# Patient Record
Sex: Female | Born: 1975 | Race: Black or African American | Hispanic: No | Smoking: Current some day smoker
Health system: Southern US, Community
[De-identification: ages and names within clinical notes are randomized; demographics above are authoritative.]

## PROBLEM LIST (undated history)

## (undated) DIAGNOSIS — L409 Psoriasis, unspecified: Secondary | ICD-10-CM

## (undated) DIAGNOSIS — D649 Anemia, unspecified: Secondary | ICD-10-CM

## (undated) DIAGNOSIS — Z5189 Encounter for other specified aftercare: Secondary | ICD-10-CM

## (undated) DIAGNOSIS — M1990 Unspecified osteoarthritis, unspecified site: Secondary | ICD-10-CM

## (undated) DIAGNOSIS — K611 Rectal abscess: Secondary | ICD-10-CM

## (undated) DIAGNOSIS — F32A Depression, unspecified: Secondary | ICD-10-CM

## (undated) HISTORY — PX: TUBAL LIGATION: SHX77

## (undated) HISTORY — DX: Psoriasis, unspecified: L40.9

## (undated) HISTORY — DX: Encounter for other specified aftercare: Z51.89

## (undated) HISTORY — DX: Depression, unspecified: F32.A

## (undated) HISTORY — PX: COLON SURGERY: SHX602

## (undated) HISTORY — DX: Anemia, unspecified: D64.9

## (undated) HISTORY — DX: Unspecified osteoarthritis, unspecified site: M19.90

## (undated) HISTORY — PX: COLOSTOMY REVERSAL: SHX5782

## (undated) HISTORY — PX: COLOSTOMY: SHX63

## (undated) HISTORY — PX: RECTAL SURGERY: SHX760

---

## 2012-10-22 LAB — HM HIV SCREENING LAB: HM HIV Screening: NEGATIVE

## 2012-12-12 LAB — HM HEPATITIS C SCREENING LAB: HM Hepatitis Screen: NEGATIVE

## 2021-06-06 LAB — COLOGUARD: Cologuard: POSITIVE — AB

## 2021-06-09 LAB — COLOGUARD: COLOGUARD: POSITIVE — AB

## 2021-11-15 LAB — HM COLONOSCOPY

## 2022-02-21 ENCOUNTER — Other Ambulatory Visit: Payer: Self-pay

## 2022-02-21 ENCOUNTER — Ambulatory Visit (INDEPENDENT_AMBULATORY_CARE_PROVIDER_SITE_OTHER): Payer: Self-pay

## 2022-02-21 ENCOUNTER — Encounter (HOSPITAL_COMMUNITY): Payer: Self-pay | Admitting: Emergency Medicine

## 2022-02-21 ENCOUNTER — Ambulatory Visit (HOSPITAL_COMMUNITY)
Admission: EM | Admit: 2022-02-21 | Discharge: 2022-02-21 | Disposition: A | Discharge status: Home / Self Care | Payer: Self-pay | Attending: Family Medicine | Admitting: Family Medicine

## 2022-02-21 DIAGNOSIS — R079 Chest pain, unspecified: Secondary | ICD-10-CM

## 2022-02-21 DIAGNOSIS — M549 Dorsalgia, unspecified: Secondary | ICD-10-CM | POA: Insufficient documentation

## 2022-02-21 DIAGNOSIS — R1010 Upper abdominal pain, unspecified: Secondary | ICD-10-CM | POA: Insufficient documentation

## 2022-02-21 HISTORY — DX: Rectal abscess: K61.1

## 2022-02-21 LAB — POCT URINALYSIS DIPSTICK, ED / UC
Bilirubin Urine: NEGATIVE
Glucose, UA: NEGATIVE mg/dL
Hgb urine dipstick: NEGATIVE
Leukocytes,Ua: NEGATIVE
Nitrite: NEGATIVE
Protein, ur: NEGATIVE mg/dL
Specific Gravity, Urine: 1.02 (ref 1.005–1.030)
Urobilinogen, UA: 0.2 mg/dL (ref 0.0–1.0)
pH: 6.5 (ref 5.0–8.0)

## 2022-02-21 LAB — CBC WITH DIFFERENTIAL/PLATELET
Abs Immature Granulocytes: 0.02 10*3/uL (ref 0.00–0.07)
Basophils Absolute: 0.1 10*3/uL (ref 0.0–0.1)
Basophils Relative: 1 %
Eosinophils Absolute: 0.1 10*3/uL (ref 0.0–0.5)
Eosinophils Relative: 1 %
HCT: 37.2 % (ref 36.0–46.0)
Hemoglobin: 12.1 g/dL (ref 12.0–15.0)
Immature Granulocytes: 0 %
Lymphocytes Relative: 40 %
Lymphs Abs: 4.3 10*3/uL — ABNORMAL HIGH (ref 0.7–4.0)
MCH: 30.6 pg (ref 26.0–34.0)
MCHC: 32.5 g/dL (ref 30.0–36.0)
MCV: 94.2 fL (ref 80.0–100.0)
Monocytes Absolute: 0.8 10*3/uL (ref 0.1–1.0)
Monocytes Relative: 7 %
Neutro Abs: 5.4 10*3/uL (ref 1.7–7.7)
Neutrophils Relative %: 51 %
Platelets: 333 10*3/uL (ref 150–400)
RBC: 3.95 MIL/uL (ref 3.87–5.11)
RDW: 14.6 % (ref 11.5–15.5)
WBC: 10.7 10*3/uL — ABNORMAL HIGH (ref 4.0–10.5)
nRBC: 0 % (ref 0.0–0.2)

## 2022-02-21 LAB — COMPREHENSIVE METABOLIC PANEL
ALT: 15 U/L (ref 0–44)
AST: 19 U/L (ref 15–41)
Albumin: 4 g/dL (ref 3.5–5.0)
Alkaline Phosphatase: 46 U/L (ref 38–126)
Anion gap: 10 (ref 5–15)
BUN: 6 mg/dL (ref 6–20)
CO2: 24 mmol/L (ref 22–32)
Calcium: 9.1 mg/dL (ref 8.9–10.3)
Chloride: 104 mmol/L (ref 98–111)
Creatinine, Ser: 0.71 mg/dL (ref 0.44–1.00)
GFR, Estimated: >60 mL/min (ref >60)
Glucose, Bld: 90 mg/dL (ref 70–99)
Potassium: 4.5 mmol/L (ref 3.5–5.1)
Sodium: 138 mmol/L (ref 135–145)
Total Bilirubin: 0.4 mg/dL (ref 0.3–1.2)
Total Protein: 7.1 g/dL (ref 6.5–8.1)

## 2022-02-21 LAB — LIPASE, BLOOD: Lipase: 28 U/L (ref 11–51)

## 2022-02-21 LAB — POCT RAPID STREP A, ED / UC: Streptococcus, Group A Screen (Direct): NEGATIVE

## 2022-02-21 MED ORDER — DICLOFENAC SODIUM 75 MG PO TBEC: 75.0000 mg | DELAYED_RELEASE_TABLET | Freq: Two times a day (BID) | ORAL | 0 refills | Status: DC

## 2022-02-21 NOTE — ED Triage Notes (Addendum)
Back pain started one week ago.  Lower back pain radiating around to front of torso, under ribs.  Burning with urination approx one month ago, but none now.    Bowel movements are normally irregular.  Reports of cyst on rectum requiring surgery and patient had a colostomy bag.  This has since been reversed

## 2022-02-21 NOTE — Discharge Instructions (Signed)
You have had labs (blood work) went today. We will call you with any significant abnormalities or if there is need to begin or change treatment or pursue further follow up.  You may also review your test results online through [facility]. If you do not have a MyChart account, instructions to sign up should be on your discharge paperwork.  You have been seen today for abdominal pain. Your evaluation was not suggestive of any emergent condition requiring medical intervention at this time. However, some abdominal problems make take more time to appear. Therefore, it is very important for you to pay attention to any new symptoms or worsening of your current condition.  Please return here or to the Emergency Department immediately should you begin to feel worse in any way or have any of the following symptoms: increasing or different abdominal pain, persistent vomiting, inability to drink fluids, fevers, or shaking chills.

## 2022-02-22 NOTE — ED Provider Notes (Signed)
[facility]   [id number] 02/21/22 Arrival Time: 1550  ASSESSMENT & PLAN:  1. Mid back pain   2. Upper abdominal pain    Unclear etiology of her symptoms. Lab work pending. Benign abdomen. Question adhesion pain from distant abdominal surgeries. Able to ambulate here and hemodynamically stable.  I have personally viewed the imaging studies ordered this visit. Chest xray without acute changes.  Begin: Meds ordered this encounter  Medications   diclofenac (VOLTAREN) 75 MG EC tablet    Sig: Take 1 tablet (75 mg total) by mouth 2 (two) times daily.    Dispense:  14 tablet    Refill:  0   Encourage ROM/movement as tolerated.  Recommend:  Follow-up Information     [facility].   Specialty: Emergency Medicine Why: If symptoms worsen in any way. Contact information: [address] [id number] [place] [phone]                Reviewed expectations re: course of current medical issues. Questions answered. Outlined signs and symptoms indicating need for more acute intervention. Patient verbalized understanding. After Visit Summary given.   SUBJECTIVE: History from: patient.  Olivia Hernandez is a 46 y.o. female who presents with complaint of vague intermittent bilateral lower back discomfort that radiates into abdomen. Onset gradual. First noted  approx one weeks ago . Injury/trama: no. History of back problems requiring medical care: occasional. Pain described as aching when present. Aggravating factors: have not been identified. Alleviating factors: have not been identified. Progressive LE weakness or saddle anesthesia: none. Extremity sensation changes or weakness: none. Ambulatory without difficulty. Normal bowel/bladder habits: yes; without urinary retention. Normal PO intake without n/v. No associated abdominal pain/n/v. History of distant abdominal surgery for colostomy and  reversal.  Reports no chronic steroid use, fevers, IV drug use, or recent back surgeries or procedures.  OBJECTIVE:  Vitals:   02/21/22 1654  BP: 126/82  Pulse: 76  Resp: 18  Temp: 98.3 F (36.8 C)  TempSrc: Oral  SpO2: 99%    General appearance: alert; no distress HEENT: ; AT Neck: supple with FROM; without midline tenderness CV: regular Lungs: unlabored respirations; speaks full sentences without difficulty Abdomen: soft, non-tender; non-distended Back: mild  and poorly localized tenderness to palpation over lumbar paraspinal musculature ; FROM at waist; bruising: none; without midline tenderness Extremities: without edema; symmetrical without gross deformities; normal ROM of bilateral LE Skin: warm and dry Neurologic: normal gait; normal sensation and strength of bilateral LE Psychological: alert and cooperative; normal mood and affect  Labs:  Labs Reviewed  CBC WITH DIFFERENTIAL/PLATELET   POCT URINALYSIS DIPSTICK, ED / UC - Abnormal; Notable for the following components:   Ketones, ur TRACE (*)    All other components within normal limits  COMPREHENSIVE METABOLIC PANEL  LIPASE, BLOOD  POC URINE PREG, ED  POCT RAPID STREP A, ED / UC    Imaging: DG Chest 2 View  Result Date: 02/21/2022 CLINICAL DATA:  Chest pain EXAM: CHEST - 2 VIEW COMPARISON:  None Available. FINDINGS: Cardiac size is within normal limits. There are no signs of pulmonary edema or focal pulmonary consolidation. There is no pleural effusion or pneumothorax. Pectus excavatum deformity is noted. IMPRESSION: No active cardiopulmonary disease. Electronically Signed   By: Elmer Picker M.D.   On: 02/21/2022 18:27    No Known Allergies  Past Medical History:  Diagnosis Date   Rectal abscess    Social History  Socioeconomic History   Marital status: Single    Spouse name: Not on file   Number of children: Not on file   Years of education: Not on file   Highest education level: Not on  file  Occupational History   Not on file  Tobacco Use   Smoking status: Every Day    Types: Cigarettes   Smokeless tobacco: Never  Vaping Use   Vaping Use: Never used  Substance and Sexual Activity   Alcohol use: Yes   Drug use: Never   Sexual activity: Not on file  Other Topics Concern   Not on file  Social History Narrative   Not on file   Social Determinants of Health   Financial Resource Strain: Not on file  Food Insecurity: Not on file  Transportation Needs: Not on file  Physical Activity: Not on file  Stress: Not on file  Social Connections: Not on file  Intimate Partner Violence: Not on file   History reviewed. No pertinent family history. Past Surgical History:  Procedure Laterality Date   COLOSTOMY     COLOSTOMY REVERSAL     RECTAL SURGERY        Vanessa Kick, MD 02/22/22 [phone]

## 2023-02-18 ENCOUNTER — Ambulatory Visit (INDEPENDENT_AMBULATORY_CARE_PROVIDER_SITE_OTHER): Payer: Commercial Managed Care - PPO | Admitting: Nurse Practitioner

## 2023-02-18 ENCOUNTER — Encounter: Payer: Self-pay | Admitting: Nurse Practitioner

## 2023-02-18 VITALS — BP 112/82 | HR 80 | Temp 97.3°F | Ht 69.0 in | Wt 151.8 lb

## 2023-02-18 DIAGNOSIS — L304 Erythema intertrigo: Secondary | ICD-10-CM

## 2023-02-18 DIAGNOSIS — M0609 Rheumatoid arthritis without rheumatoid factor, multiple sites: Secondary | ICD-10-CM | POA: Diagnosis not present

## 2023-02-18 DIAGNOSIS — H60501 Unspecified acute noninfective otitis externa, right ear: Secondary | ICD-10-CM

## 2023-02-18 DIAGNOSIS — K5909 Other constipation: Secondary | ICD-10-CM

## 2023-02-18 DIAGNOSIS — Z1231 Encounter for screening mammogram for malignant neoplasm of breast: Secondary | ICD-10-CM

## 2023-02-18 MED ORDER — OFLOXACIN 0.3 % OT SOLN: 5.0000 [drp] | Freq: Every day | OTIC | 0 refills | Status: DC

## 2023-02-18 MED ORDER — NYSTATIN 100000 UNIT/GM EX POWD: 1.0000 | Freq: Three times a day (TID) | CUTANEOUS | 0 refills | Status: DC

## 2023-02-18 NOTE — Patient Instructions (Addendum)
It was great to see you!  We have you on the schedule for the mobile mammogram at our office on 03/18/23 at [facility] the ear drops, 5 drops in your right ear once a day for 7 days.   Let's follow-up in 4 weeks, sooner if you have concerns.  If a referral was placed today, you will be contacted for an appointment. Please note that routine referrals can sometimes take up to 3-4 weeks to process. Please call our office if you haven't heard anything after this time frame.  Take care,  Rodman Pickle, NP

## 2023-02-18 NOTE — Progress Notes (Unsigned)
New Patient Visit  BP 112/82 (BP Location: Left Arm)   Pulse 80   Temp (!) 97.3 F (36.3 C)   Ht 5\' 9"  (1.753 m)   Wt 151 lb 12.8 oz (68.9 kg)   LMP 12/28/2022 (Exact Date)   SpO2 98%   BMI 22.42 kg/m    Subjective:    Patient ID: Olivia Hernandez, female    DOB: 06/14/1976, 47 y.o.   MRN: [id number]  CC: Chief Complaint  Patient presents with   Establish Care    NP. Est. Care, concerns with right ear pain and fluid in it, medication management    HPI: Olivia Hernandez is a 47 y.o. female presents for new patient visit to establish care.  Introduced to Publishing rights manager role and practice setting.  All questions answered.  Discussed provider/patient relationship and expectations.  She has a history of chronic constipation. She has been treating this by eating dragon fruit regularly, drinking water, and taking miralax as needed. She denies abdominal pain, nausea, and vomiting.    She has a history of rheumatoid artritis. She was taking humira in the past. She states that a few weeks ago she was having pain in her left wrist, but that has improved. She states that the most of the pain is in her wrists and knees, although it will sometimes happen in her ankles.   She also notes that for the last few days she has been having itching and drainage from her right ear. She states that she has a history of having fluid in her ears. She denies recent swimming. She denies pain, fevers, nasal congestion, and sore throat. She has tried to clean her ear with a q-tip.   Depression and Anxiety Screen done:     02/18/2023    3:08 PM  Depression screen PHQ 2/9  Decreased Interest 1  Down, Depressed, Hopeless 0  PHQ - 2 Score 1  Altered sleeping 3  Tired, decreased energy 1  Change in appetite 1  Feeling bad or failure about yourself  0  Trouble concentrating 0  Moving slowly or fidgety/restless 0  Suicidal thoughts 0  PHQ-9 Score 6  Difficult doing work/chores Not difficult at all       02/18/2023    3:09 PM  GAD 7 : Generalized Anxiety Score  Nervous, Anxious, on Edge 1  Control/stop worrying 1  Worry too much - different things 1  Trouble relaxing 1  Restless 1  Easily annoyed or irritable 0  Afraid - awful might happen 0  Total GAD 7 Score 5  Anxiety Difficulty Not difficult at all    Past Medical History:  Diagnosis Date   Anemia    Arthritis    Blood transfusion without reported diagnosis    Depression    Rectal abscess     Past Surgical History:  Procedure Laterality Date   CESAREAN SECTION  05/01/2004   COLON SURGERY  07/06/2014   Had a colostomy surgery   COLOSTOMY     COLOSTOMY REVERSAL     RECTAL SURGERY     TUBAL LIGATION  05/01/2004    Family History  Problem Relation Age of Onset   Alcohol abuse Mother    COPD Mother    Diabetes Mother    Drug abuse Mother    Heart disease Mother    Hypertension Mother    Stroke Mother    Varicose Veins Mother    Cancer Father        liver  COPD Father    Drug abuse Father    Hypertension Father    Kidney disease Daughter    Drug abuse Son    Early death Son      Social History   Tobacco Use   Smoking status: Some Days    Packs/day: 0.00    Years: 15.00    Additional pack years: 0.00    Total pack years: 0.00    Types: Cigarettes   Smokeless tobacco: Never   Tobacco comments:    Smoked on and off for years  Vaping Use   Vaping Use: Never used  Substance Use Topics   Alcohol use: Not Currently    Comment: I rarely drink   Drug use: Never    Current Outpatient Medications on File Prior to Visit  Medication Sig Dispense Refill   acetaminophen (TYLENOL) 500 MG tablet Take 1 tablet by mouth every 6 (six) hours as needed.     No current facility-administered medications on file prior to visit.     Review of Systems  Constitutional:  Positive for fatigue. Negative for fever.  HENT:  Positive for ear pain (right ear fullness). Negative for congestion and sore throat.    Respiratory: Negative.    Cardiovascular: Negative.   Gastrointestinal:  Positive for abdominal pain (LLQ at times) and constipation (at times). Negative for diarrhea, nausea and vomiting.  Genitourinary: Negative.   Musculoskeletal:  Positive for arthralgias.  Skin:        Dry patches behind her ears, rash under bilateral axilla  Neurological: Negative.   Psychiatric/Behavioral:  Positive for sleep disturbance. Negative for dysphoric mood. The patient is not nervous/anxious.       Objective:    BP 112/82 (BP Location: Left Arm)   Pulse 80   Temp (!) 97.3 F (36.3 C)   Ht 5\' 9"  (1.753 m)   Wt 151 lb 12.8 oz (68.9 kg)   LMP 12/28/2022 (Exact Date)   SpO2 98%   BMI 22.42 kg/m   Wt Readings from Last 3 Encounters:  02/18/23 151 lb 12.8 oz (68.9 kg)    BP Readings from Last 3 Encounters:  02/18/23 112/82  02/21/22 126/82    Physical Exam Vitals and nursing note reviewed.  Constitutional:      General: She is not in acute distress.    Appearance: Normal appearance.  HENT:     Head: Normocephalic and atraumatic.     Right Ear: External ear normal. Drainage and swelling present.     Left Ear: Tympanic membrane, ear canal and external ear normal.  Eyes:     Conjunctiva/sclera: Conjunctivae normal.  Cardiovascular:     Rate and Rhythm: Normal rate and regular rhythm.     Pulses: Normal pulses.     Heart sounds: Normal heart sounds.  Pulmonary:     Effort: Pulmonary effort is normal.     Breath sounds: Normal breath sounds.  Abdominal:     Palpations: Abdomen is soft.     Tenderness: There is no abdominal tenderness.  Musculoskeletal:        General: Normal range of motion.     Cervical back: Normal range of motion and neck supple.     Right lower leg: No edema.     Left lower leg: No edema.  Lymphadenopathy:     Cervical: No cervical adenopathy.  Skin:    General: Skin is warm and dry.     Findings: Rash (erythema under bilateral axilla) present.  Neurological:  General: No focal deficit present.     Mental Status: She is alert and oriented to person, place, and time.     Coordination: Coordination normal.     Gait: Gait normal.  Psychiatric:        Mood and Affect: Mood normal.        Behavior: Behavior normal.        Thought Content: Thought content normal.        Judgment: Judgment normal.       Assessment & Plan:   Problem List Items Addressed This Visit       Digestive   Chronic constipation    Chronic, stable. Continue drinking fluids, and taking miralax daily as needed.         Musculoskeletal and Integument   Rheumatoid arthritis of multiple sites with negative rheumatoid factor (HCC) - Primary    Chronic, stable. She was taking humira for this, however has been off this for several months. Will place referral to rheumatology to discuss treatment options.       Relevant Medications   acetaminophen (TYLENOL) 500 MG tablet   Other Relevant Orders   Ambulatory referral to Rheumatology   Other Visit Diagnoses     Acute otitis externa of right ear, unspecified type       Start oflaxacin ear drops daily x7 days. Follow-up if symptoms don't improve.   Intertrigo       Noted under bilateral axilla. Wash area with soap and water daily. Start nystatin powder TID prn.   Encounter for screening mammogram for malignant neoplasm of breast       Mammogram scheduled for 03/18/23 at 3pm at our office on the mobile mammogram bus   Relevant Orders   MM 3D SCREENING MAMMOGRAM BILATERAL BREAST        Follow up plan: Return in about 4 weeks (around 03/18/2023) for CPE.

## 2023-02-20 ENCOUNTER — Encounter: Payer: Self-pay | Admitting: Nurse Practitioner

## 2023-02-20 DIAGNOSIS — M0609 Rheumatoid arthritis without rheumatoid factor, multiple sites: Secondary | ICD-10-CM | POA: Insufficient documentation

## 2023-02-20 DIAGNOSIS — K5909 Other constipation: Secondary | ICD-10-CM | POA: Insufficient documentation

## 2023-02-20 NOTE — Assessment & Plan Note (Signed)
Chronic, stable. She was taking humira for this, however has been off this for several months. Will place referral to rheumatology to discuss treatment options.

## 2023-02-20 NOTE — Assessment & Plan Note (Signed)
Chronic, stable. Continue drinking fluids, and taking miralax daily as needed.

## 2023-03-18 ENCOUNTER — Ambulatory Visit (INDEPENDENT_AMBULATORY_CARE_PROVIDER_SITE_OTHER): Payer: Commercial Managed Care - PPO | Admitting: Nurse Practitioner

## 2023-03-18 ENCOUNTER — Encounter: Payer: Self-pay | Admitting: Nurse Practitioner

## 2023-03-18 ENCOUNTER — Ambulatory Visit
Admission: RE | Admit: 2023-03-18 | Discharge: 2023-03-18 | Disposition: A | Discharge status: Home / Self Care | Payer: Commercial Managed Care - PPO | Source: Ambulatory Visit | Attending: Nurse Practitioner | Admitting: Nurse Practitioner

## 2023-03-18 ENCOUNTER — Other Ambulatory Visit (HOSPITAL_COMMUNITY)
Admission: RE | Admit: 2023-03-18 | Discharge: 2023-03-18 | Disposition: A | Discharge status: Home / Self Care | Payer: Commercial Managed Care - PPO | Source: Ambulatory Visit | Attending: Nurse Practitioner | Admitting: Nurse Practitioner

## 2023-03-18 VITALS — BP 108/72 | HR 84 | Temp 97.1°F | Ht 69.0 in | Wt 150.6 lb

## 2023-03-18 DIAGNOSIS — Z124 Encounter for screening for malignant neoplasm of cervix: Secondary | ICD-10-CM | POA: Insufficient documentation

## 2023-03-18 DIAGNOSIS — Z1231 Encounter for screening mammogram for malignant neoplasm of breast: Secondary | ICD-10-CM

## 2023-03-18 DIAGNOSIS — Z0000 Encounter for general adult medical examination without abnormal findings: Secondary | ICD-10-CM | POA: Diagnosis not present

## 2023-03-18 DIAGNOSIS — R7303 Prediabetes: Secondary | ICD-10-CM | POA: Diagnosis not present

## 2023-03-18 DIAGNOSIS — E782 Mixed hyperlipidemia: Secondary | ICD-10-CM | POA: Diagnosis not present

## 2023-03-18 DIAGNOSIS — Z1211 Encounter for screening for malignant neoplasm of colon: Secondary | ICD-10-CM

## 2023-03-18 NOTE — Assessment & Plan Note (Signed)
Last A1c was 5.7%.  Will check A1c today. 

## 2023-03-18 NOTE — Assessment & Plan Note (Signed)
Chronic, stable. Check CMP, CBC, lipid panel today. Treat based on results.

## 2023-03-18 NOTE — Patient Instructions (Signed)
It was great to see you!  We are checking your labs today and will let you know the results via mychart/phone.   I have placed a referral to GI for your colonoscopy.   Let's follow-up in 1 year, sooner if you have concerns.  If a referral was placed today, you will be contacted for an appointment. Please note that routine referrals can sometimes take up to 3-4 weeks to process. Please call our office if you haven't heard anything after this time frame.  Take care,  Rodman Pickle, NP

## 2023-03-18 NOTE — Progress Notes (Signed)
BP 108/72 (BP Location: Left Arm)   Pulse 84   Temp (!) 97.1 F (36.2 C)   Ht 5\' 9"  (1.753 m)   Wt 150 lb 9.6 oz (68.3 kg)   LMP 12/28/2022 (Exact Date)   SpO2 96%   BMI 22.24 kg/m    Subjective:    Patient ID: Olivia Hernandez, female    DOB: March 25, 1976, 47 y.o.   MRN: [id number]  CC: Chief Complaint  Patient presents with   Annual Exam    With lab work-patient is not fasting, no concerns    HPI: Olivia Hernandez is a 47 y.o. female presenting on 03/18/2023 for comprehensive medical examination. Current medical complaints include:none  She currently lives with: fiance Menopausal Symptoms: yes - hot flashes, taking estroven LMP 12/28/22  Depression and Anxiety Screen done today and results listed below:     03/18/2023    3:26 PM 02/18/2023    3:08 PM  Depression screen PHQ 2/9  Decreased Interest 0 1  Down, Depressed, Hopeless 0 0  PHQ - 2 Score 0 1  Altered sleeping 1 3  Tired, decreased energy 0 1  Change in appetite 0 1  Feeling bad or failure about yourself  0 0  Trouble concentrating 0 0  Moving slowly or fidgety/restless 0 0  Suicidal thoughts 0 0  PHQ-9 Score 1 6  Difficult doing work/chores Not difficult at all Not difficult at all      03/18/2023    3:27 PM 02/18/2023    3:09 PM  GAD 7 : Generalized Anxiety Score  Nervous, Anxious, on Edge 0 1  Control/stop worrying 1 1  Worry too much - different things 1 1  Trouble relaxing 0 1  Restless 0 1  Easily annoyed or irritable 0 0  Afraid - awful might happen 1 0  Total GAD 7 Score 3 5  Anxiety Difficulty Not difficult at all Not difficult at all    The patient does not have a history of falls. I did not complete a risk assessment for falls. A plan of care for falls was not documented.   Past Medical History:  Past Medical History:  Diagnosis Date   Anemia    Arthritis    Blood transfusion without reported diagnosis    Depression    Rectal abscess     Surgical History:  Past Surgical History:   Procedure Laterality Date   CESAREAN SECTION  05/01/2004   COLON SURGERY  07/06/2014   Had a colostomy surgery   COLOSTOMY     COLOSTOMY REVERSAL     RECTAL SURGERY     TUBAL LIGATION  05/01/2004    Medications:  Current Outpatient Medications on File Prior to Visit  Medication Sig   acetaminophen (TYLENOL) 500 MG tablet Take 1 tablet by mouth every 6 (six) hours as needed.   nystatin (MYCOSTATIN/NYSTOP) powder Apply 1 Application topically 3 (three) times daily. (Patient not taking: Reported on 03/18/2023)   ofloxacin (FLOXIN) 0.3 % OTIC solution Place 5 drops into the right ear daily. (Patient not taking: Reported on 03/18/2023)   No current facility-administered medications on file prior to visit.    Allergies:  Allergies  Allergen Reactions   Polycillin-N [Ampicillin] Swelling    Tongue Swelled    Social History:  Social History   Socioeconomic History   Marital status: Divorced    Spouse name: Not on file   Number of children: 6   Years of education: Not on file  Highest education level: Not on file  Occupational History   Not on file  Tobacco Use   Smoking status: Some Days    Packs/day: 0.00    Years: 15.00    Additional pack years: 0.00    Total pack years: 0.00    Types: Cigarettes   Smokeless tobacco: Never   Tobacco comments:    Smoked on and off for years  Vaping Use   Vaping Use: Never used  Substance and Sexual Activity   Alcohol use: Not Currently    Comment: I rarely drink   Drug use: Never   Sexual activity: Yes    Birth control/protection: Surgical  Other Topics Concern   Not on file  Social History Narrative   Not on file   Social Determinants of Health   Financial Resource Strain: Not on file  Food Insecurity: Not on file  Transportation Needs: Not on file  Physical Activity: Not on file  Stress: Not on file  Social Connections: Not on file  Intimate Partner Violence: Not on file   Social History   Tobacco Use  Smoking  Status Some Days   Packs/day: 0.00   Years: 15.00   Additional pack years: 0.00   Total pack years: 0.00   Types: Cigarettes  Smokeless Tobacco Never  Tobacco Comments   Smoked on and off for years   Social History   Substance and Sexual Activity  Alcohol Use Not Currently   Comment: I rarely drink    Family History:  Family History  Problem Relation Age of Onset   Alcohol abuse Mother    COPD Mother    Diabetes Mother    Drug abuse Mother    Heart disease Mother    Hypertension Mother    Stroke Mother    Varicose Veins Mother    Cancer Father        liver   COPD Father    Drug abuse Father    Hypertension Father    Kidney disease Daughter    Drug abuse Son    Early death Son    Breast cancer Neg Hx     Past medical history, surgical history, medications, allergies, family history and social history reviewed with patient today and changes made to appropriate areas of the chart.   Review of Systems  Constitutional: Negative.   HENT: Negative.    Eyes: Negative.   Respiratory: Negative.    Cardiovascular: Negative.   Gastrointestinal: Negative.   Genitourinary: Negative.   Musculoskeletal: Negative.   Skin: Negative.   Neurological: Negative.   Psychiatric/Behavioral: Negative.     All other ROS negative except what is listed above and in the HPI.      Objective:    BP 108/72 (BP Location: Left Arm)   Pulse 84   Temp (!) 97.1 F (36.2 C)   Ht 5\' 9"  (1.753 m)   Wt 150 lb 9.6 oz (68.3 kg)   LMP 12/28/2022 (Exact Date)   SpO2 96%   BMI 22.24 kg/m   Wt Readings from Last 3 Encounters:  03/18/23 150 lb 9.6 oz (68.3 kg)  02/18/23 151 lb 12.8 oz (68.9 kg)    Physical Exam Vitals and nursing note reviewed. Exam conducted with a chaperone present.  Constitutional:      General: She is not in acute distress.    Appearance: Normal appearance.  HENT:     Head: Normocephalic and atraumatic.     Right Ear: Tympanic membrane, ear canal and  external ear  normal.     Left Ear: Tympanic membrane, ear canal and external ear normal.  Eyes:     Conjunctiva/sclera: Conjunctivae normal.  Cardiovascular:     Rate and Rhythm: Normal rate and regular rhythm.     Pulses: Normal pulses.     Heart sounds: Normal heart sounds.  Pulmonary:     Effort: Pulmonary effort is normal.     Breath sounds: Normal breath sounds.  Abdominal:     Palpations: Abdomen is soft.     Tenderness: There is no abdominal tenderness.  Genitourinary:    General: Normal vulva.     Exam position: Lithotomy position.     Labia:        Right: No rash, tenderness or lesion.        Left: No rash, tenderness or lesion.      Vagina: Normal.     Cervix: Normal.     Uterus: Normal.      Adnexa: Right adnexa normal and left adnexa normal.  Musculoskeletal:        General: Normal range of motion.     Cervical back: Normal range of motion and neck supple.     Right lower leg: No edema.     Left lower leg: No edema.  Lymphadenopathy:     Cervical: No cervical adenopathy.  Skin:    General: Skin is warm and dry.  Neurological:     General: No focal deficit present.     Mental Status: She is alert and oriented to person, place, and time.     Cranial Nerves: No cranial nerve deficit.     Coordination: Coordination normal.     Gait: Gait normal.  Psychiatric:        Mood and Affect: Mood normal.        Behavior: Behavior normal.        Thought Content: Thought content normal.        Judgment: Judgment normal.     Results for orders placed or performed in visit on 02/18/23  Cologuard  Result Value Ref Range   Cologuard Positive (A) Negative  HM COLONOSCOPY  Result Value Ref Range   HM Colonoscopy See Report (in chart) See Report (in chart), Patient Reported      Assessment & Plan:   Problem List Items Addressed This Visit       Other   Routine general medical examination at a health care facility - Primary    Health maintenance reviewed and updated. Discussed  nutrition, exercise. Check CMP, CBC, TSH today. Follow-up 1 year.        Relevant Orders   CBC with Differential/Platelet   Comprehensive metabolic panel   TSH   Prediabetes    Last A1c was 5.7%. Will check A1c today.       Relevant Orders   Hemoglobin A1c   Mixed hyperlipidemia    Chronic, stable. Check CMP, CBC, lipid panel today. Treat based on results.       Relevant Orders   Lipid panel   Other Visit Diagnoses     Screening for cervical cancer       pap with HPV done today   Relevant Orders   Cytology - PAP   Screen for colon cancer       Last colonoscopy done 11/15/21 and recommended 1 year follow-up colonscopy with 2 day prep. Referral placed to GI.   Relevant Orders   Ambulatory referral to Gastroenterology  Follow up plan: Return in about 1 year (around 03/17/2024) for CPE.   LABORATORY TESTING:  - Pap smear: pap done  IMMUNIZATIONS:   - Tdap: Tetanus vaccination status reviewed: last tetanus booster within 10 years. - Influenza: Postponed to flu season - Pneumovax: Not applicable - Prevnar: Not applicable - HPV: Not applicable - Zostavax vaccine: Not applicable  SCREENING: -Mammogram: Up to date  - Colonoscopy: Ordered today  - Bone Density: Not applicable   PATIENT COUNSELING:   Advised to take 1 mg of folate supplement per day if capable of pregnancy.   Sexuality: Discussed sexually transmitted diseases, partner selection, use of condoms, avoidance of unintended pregnancy  and contraceptive alternatives.   Advised to avoid cigarette smoking.  I discussed with the patient that most people either abstain from alcohol or drink within safe limits (<=14/week and <=4 drinks/occasion for males, <=7/weeks and <= 3 drinks/occasion for females) and that the risk for alcohol disorders and other health effects rises proportionally with the number of drinks per week and how often a drinker exceeds daily limits.  Discussed cessation/primary prevention  of drug use and availability of treatment for abuse.   Diet: Encouraged to adjust caloric intake to maintain  or achieve ideal body weight, to reduce intake of dietary saturated fat and total fat, to limit sodium intake by avoiding high sodium foods and not adding table salt, and to maintain adequate dietary potassium and calcium preferably from fresh fruits, vegetables, and low-fat dairy products.    stressed the importance of regular exercise  Injury prevention: Discussed safety belts, safety helmets, smoke detector, smoking near bedding or upholstery.   Dental health: Discussed importance of regular tooth brushing, flossing, and dental visits.    NEXT PREVENTATIVE PHYSICAL DUE IN 1 YEAR. Return in about 1 year (around 03/17/2024) for CPE.

## 2023-03-18 NOTE — Assessment & Plan Note (Signed)
Health maintenance reviewed and updated. Discussed nutrition, exercise. Check CMP, CBC, TSH today. Follow-up 1 year.   

## 2023-03-19 LAB — HEMOGLOBIN A1C: Hgb A1c MFr Bld: 5.8 % (ref 4.6–6.5)

## 2023-03-19 LAB — COMPREHENSIVE METABOLIC PANEL
ALT: 10 U/L (ref 0–35)
AST: 14 U/L (ref 0–37)
Albumin: 4.1 g/dL (ref 3.5–5.2)
Alkaline Phosphatase: 54 U/L (ref 39–117)
BUN: 10 mg/dL (ref 6–23)
CO2: 26 mEq/L (ref 19–32)
Calcium: 8.8 mg/dL (ref 8.4–10.5)
Chloride: 102 mEq/L (ref 96–112)
Creatinine, Ser: 0.73 mg/dL (ref 0.40–1.20)
GFR: 97.25 mL/min (ref >60.00)
Glucose, Bld: 97 mg/dL (ref 70–99)
Potassium: 3.7 mEq/L (ref 3.5–5.1)
Sodium: 136 mEq/L (ref 135–145)
Total Bilirubin: 0.4 mg/dL (ref 0.2–1.2)
Total Protein: 7.3 g/dL (ref 6.0–8.3)

## 2023-03-19 LAB — CBC WITH DIFFERENTIAL/PLATELET
Basophils Absolute: 0.1 10*3/uL (ref 0.0–0.1)
Basophils Relative: 1.1 % (ref 0.0–3.0)
Eosinophils Absolute: 0.1 10*3/uL (ref 0.0–0.7)
Eosinophils Relative: 0.7 % (ref 0.0–5.0)
HCT: 34.3 % — ABNORMAL LOW (ref 36.0–46.0)
Hemoglobin: 11.1 g/dL — ABNORMAL LOW (ref 12.0–15.0)
Lymphocytes Relative: 37.6 % (ref 12.0–46.0)
Lymphs Abs: 3.4 10*3/uL (ref 0.7–4.0)
MCHC: 32.5 g/dL (ref 30.0–36.0)
MCV: 90.2 fl (ref 78.0–100.0)
Monocytes Absolute: 0.7 10*3/uL (ref 0.1–1.0)
Monocytes Relative: 8.1 % (ref 3.0–12.0)
Neutro Abs: 4.8 10*3/uL (ref 1.4–7.7)
Neutrophils Relative %: 52.5 % (ref 43.0–77.0)
Platelets: 311 10*3/uL (ref 150.0–400.0)
RBC: 3.81 Mil/uL — ABNORMAL LOW (ref 3.87–5.11)
RDW: 17.5 % — ABNORMAL HIGH (ref 11.5–15.5)
WBC: 9.1 10*3/uL (ref 4.0–10.5)

## 2023-03-19 LAB — LIPID PANEL
Cholesterol: 246 mg/dL — ABNORMAL HIGH (ref 0–200)
HDL: 68.5 mg/dL (ref >39.00)
LDL Cholesterol: 159 mg/dL — ABNORMAL HIGH (ref 0–99)
NonHDL: 177.54
Total CHOL/HDL Ratio: 4
Triglycerides: 91 mg/dL (ref 0.0–149.0)
VLDL: 18.2 mg/dL (ref 0.0–40.0)

## 2023-03-19 LAB — TSH: TSH: 1.55 u[IU]/mL (ref 0.35–5.50)

## 2023-03-21 LAB — CYTOLOGY - PAP
Comment: NEGATIVE
Comment: NEGATIVE
Comment: NEGATIVE
Diagnosis: NEGATIVE
HPV 16: NEGATIVE
HPV 18 / 45: NEGATIVE
High risk HPV: POSITIVE — AB

## 2023-03-25 ENCOUNTER — Encounter: Payer: Self-pay | Admitting: Nurse Practitioner

## 2023-04-04 NOTE — Progress Notes (Deleted)
Office Visit Note  Patient: Olivia Hernandez             Date of Birth: 24-Feb-1976           MRN: [id number]             PCP: Gerre Scull, NP Referring: Gerre Scull, NP Visit Date: 04/15/2023 Occupation: @GUAROCC @  Subjective:    History of Present Illness: Olivia Hernandez is a 47 y.o. female who presents today for a new patient consultation as requested by her PCP.      Activities of Daily Living:  Patient reports morning stiffness for *** {minute/hour:19697}.   Patient {ACTIONS;DENIES/REPORTS:21021675::"Denies"} nocturnal pain.  Difficulty dressing/grooming: {ACTIONS;DENIES/REPORTS:21021675::"Denies"} Difficulty climbing stairs: {ACTIONS;DENIES/REPORTS:21021675::"Denies"} Difficulty getting out of chair: {ACTIONS;DENIES/REPORTS:21021675::"Denies"} Difficulty using hands for taps, buttons, cutlery, and/or writing: {ACTIONS;DENIES/REPORTS:21021675::"Denies"}  No Rheumatology ROS completed.   PMFS History:  Patient Active Problem List   Diagnosis Date Noted  . Routine general medical examination at a health care facility 03/18/2023  . Prediabetes 03/18/2023  . Mixed hyperlipidemia 03/18/2023  . Rheumatoid arthritis of multiple sites with negative rheumatoid factor (HCC) 02/20/2023  . Chronic constipation 02/20/2023    Past Medical History:  Diagnosis Date  . Anemia   . Arthritis   . Blood transfusion without reported diagnosis   . Depression   . Rectal abscess     Family History  Problem Relation Age of Onset  . Alcohol abuse Mother   . COPD Mother   . Diabetes Mother   . Drug abuse Mother   . Heart disease Mother   . Hypertension Mother   . Stroke Mother   . Varicose Veins Mother   . Cancer Father        liver  . COPD Father   . Drug abuse Father   . Hypertension Father   . Kidney disease Daughter   . Drug abuse Son   . Early death Son   . Breast cancer Neg Hx    Past Surgical History:  Procedure Laterality Date  . CESAREAN SECTION   05/01/2004  . COLON SURGERY  07/06/2014   Had a colostomy surgery  . COLOSTOMY    . COLOSTOMY REVERSAL    . RECTAL SURGERY    . TUBAL LIGATION  05/01/2004   Social History   Social History Narrative  . Not on file   Immunization History  Administered Date(s) Administered  . Influenza,inj,Quad PF,6+ Mos 10/25/2015  . PPD Test 06/09/2013  . Tdap 01/31/2016     Objective: Vital Signs: There were no vitals taken for this visit.   Physical Exam Vitals and nursing note reviewed.  Constitutional:      Appearance: She is well-developed.  HENT:     Head: Normocephalic and atraumatic.  Eyes:     Conjunctiva/sclera: Conjunctivae normal.  Cardiovascular:     Rate and Rhythm: Normal rate and regular rhythm.     Heart sounds: Normal heart sounds.  Pulmonary:     Effort: Pulmonary effort is normal.     Breath sounds: Normal breath sounds.  Abdominal:     General: Bowel sounds are normal.     Palpations: Abdomen is soft.  Musculoskeletal:     Cervical back: Normal range of motion.  Lymphadenopathy:     Cervical: No cervical adenopathy.  Skin:    General: Skin is warm and dry.     Capillary Refill: Capillary refill takes less than 2 seconds.  Neurological:     Mental Status: She is alert  and oriented to person, place, and time.  Psychiatric:        Behavior: Behavior normal.     Musculoskeletal Exam: ***  CDAI Exam: CDAI Score: -- Patient Global: --; Provider Global: -- Swollen: --; Tender: -- Joint Exam 04/15/2023   No joint exam has been documented for this visit   There is currently no information documented on the homunculus. Go to the Rheumatology activity and complete the homunculus joint exam.  Investigation: No additional findings.  Imaging: No results found.  Recent Labs: Lab Results  Component Value Date   WBC 9.1 03/18/2023   HGB 11.1 (L) 03/18/2023   PLT 311.0 03/18/2023   NA 136 03/18/2023   K 3.7 03/18/2023   CL 102 03/18/2023   CO2 26  03/18/2023   GLUCOSE 97 03/18/2023   BUN 10 03/18/2023   CREATININE 0.73 03/18/2023   BILITOT 0.4 03/18/2023   ALKPHOS 54 03/18/2023   AST 14 03/18/2023   ALT 10 03/18/2023   PROT 7.3 03/18/2023   ALBUMIN 4.1 03/18/2023   CALCIUM 8.8 03/18/2023    Speciality Comments: No specialty comments available.  Procedures:  No procedures performed Allergies: Polycillin-n [ampicillin]   Assessment / Plan:     Visit Diagnoses: Rheumatoid arthritis of multiple sites with negative rheumatoid factor (HCC)  Mixed hyperlipidemia  Chronic constipation  Prediabetes  Elevated sed rate - ESR 48 on 04/06/19  Elevated C-reactive protein (CRP) - CRP 27 on 04/06/23  Orders: No orders of the defined types were placed in this encounter.  No orders of the defined types were placed in this encounter.   Face-to-face time spent with patient was *** minutes. Greater than 50% of time was spent in counseling and coordination of care.  Follow-Up Instructions: No follow-ups on file.   Gearldine Bienenstock, PA-C  Note - This record has been created using Dragon software.  Chart creation errors have been sought, but may not always  have been located. Such creation errors do not reflect on  the standard of medical care.

## 2023-04-09 ENCOUNTER — Other Ambulatory Visit: Payer: Self-pay | Admitting: Nurse Practitioner

## 2023-04-09 DIAGNOSIS — R928 Other abnormal and inconclusive findings on diagnostic imaging of breast: Secondary | ICD-10-CM

## 2023-04-15 ENCOUNTER — Encounter: Payer: Commercial Managed Care - PPO | Admitting: Physician Assistant

## 2023-04-15 DIAGNOSIS — E782 Mixed hyperlipidemia: Secondary | ICD-10-CM

## 2023-04-15 DIAGNOSIS — R7982 Elevated C-reactive protein (CRP): Secondary | ICD-10-CM

## 2023-04-15 DIAGNOSIS — R700 Elevated erythrocyte sedimentation rate: Secondary | ICD-10-CM

## 2023-04-15 DIAGNOSIS — K5909 Other constipation: Secondary | ICD-10-CM

## 2023-04-15 DIAGNOSIS — R7303 Prediabetes: Secondary | ICD-10-CM

## 2023-04-15 DIAGNOSIS — M0609 Rheumatoid arthritis without rheumatoid factor, multiple sites: Secondary | ICD-10-CM

## 2023-04-18 ENCOUNTER — Other Ambulatory Visit: Payer: Self-pay | Admitting: Nurse Practitioner

## 2023-04-18 ENCOUNTER — Ambulatory Visit: Admission: RE | Admit: 2023-04-18 | Payer: Commercial Managed Care - PPO | Source: Ambulatory Visit

## 2023-04-18 ENCOUNTER — Ambulatory Visit
Admission: RE | Admit: 2023-04-18 | Discharge: 2023-04-18 | Disposition: A | Discharge status: Home / Self Care | Payer: Commercial Managed Care - PPO | Source: Ambulatory Visit | Attending: Nurse Practitioner | Admitting: Nurse Practitioner

## 2023-04-18 DIAGNOSIS — R928 Other abnormal and inconclusive findings on diagnostic imaging of breast: Secondary | ICD-10-CM

## 2023-05-20 IMAGING — DX DG CHEST 2V
2 series · 2 of 2 positions shown · non-contrast
Comparison: None Available.

CLINICAL DATA: Chest pain

EXAM:
CHEST - 2 VIEW

[chest pa]
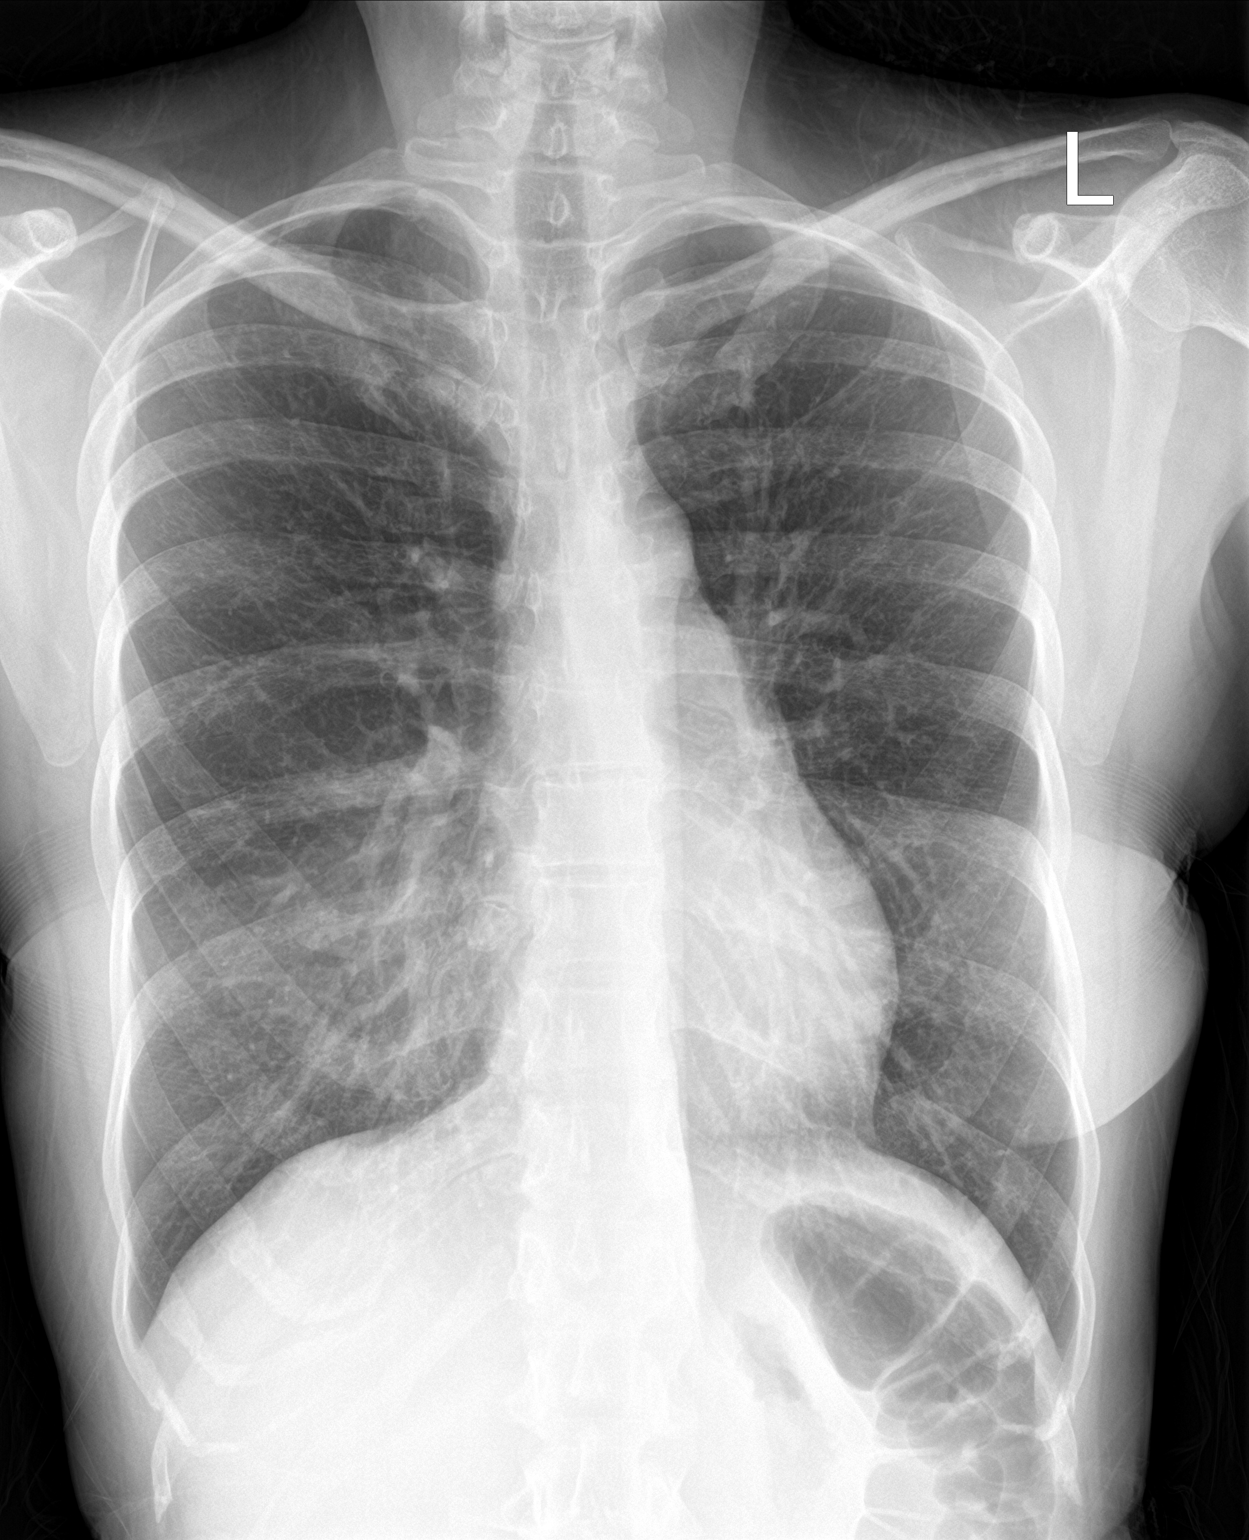

[chest lat]
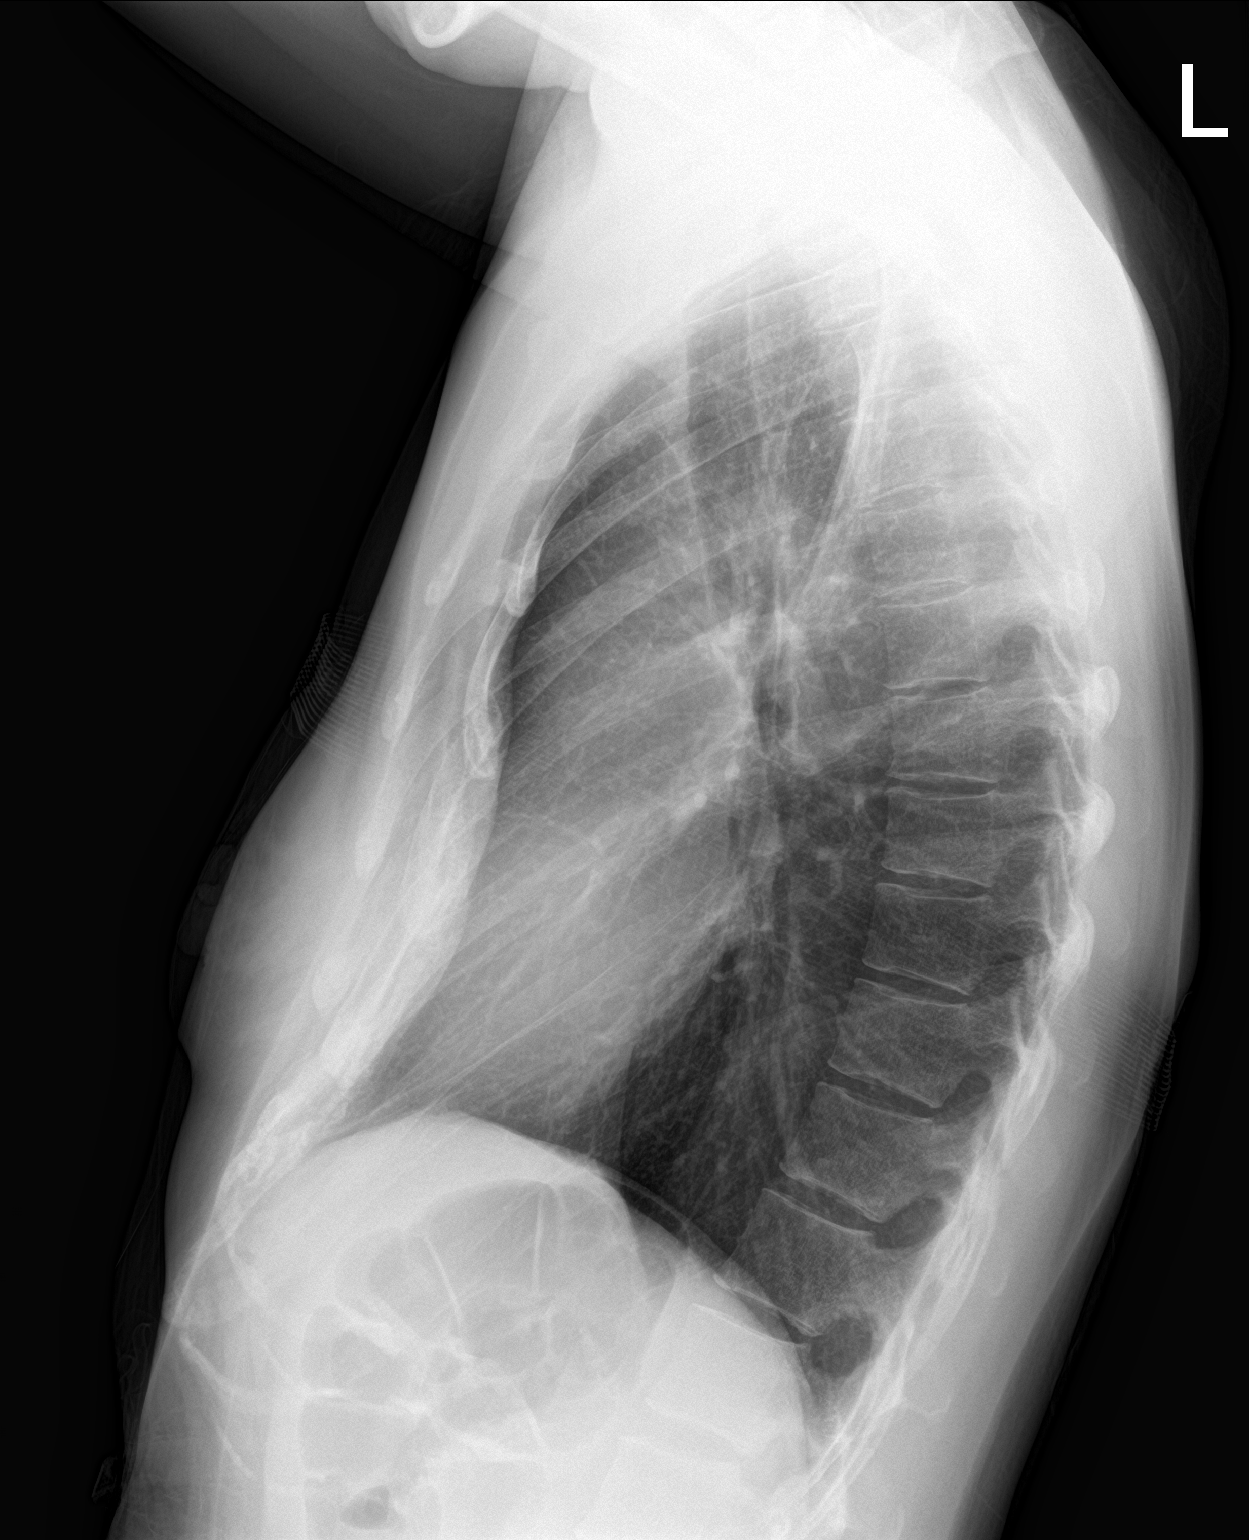

[2 of 2 positions shown; findings below may reference images not displayed]

FINDINGS: Cardiac size is within normal limits. There are no signs of
pulmonary edema or focal pulmonary consolidation. There is no
pleural effusion or pneumothorax. Pectus excavatum deformity is
noted.
IMPRESSION: No active cardiopulmonary disease.

## 2023-10-22 ENCOUNTER — Ambulatory Visit
Admission: RE | Admit: 2023-10-22 | Discharge: 2023-10-22 | Disposition: A | Discharge status: Home / Self Care | Payer: Commercial Managed Care - PPO | Source: Ambulatory Visit | Attending: Nurse Practitioner | Admitting: Nurse Practitioner

## 2023-10-22 DIAGNOSIS — R928 Other abnormal and inconclusive findings on diagnostic imaging of breast: Secondary | ICD-10-CM

## 2023-10-24 ENCOUNTER — Other Ambulatory Visit: Payer: Self-pay | Admitting: Nurse Practitioner

## 2023-10-24 DIAGNOSIS — R921 Mammographic calcification found on diagnostic imaging of breast: Secondary | ICD-10-CM

## 2023-11-27 ENCOUNTER — Encounter: Payer: Self-pay | Admitting: Nurse Practitioner

## 2023-11-27 ENCOUNTER — Telehealth: Payer: Commercial Managed Care - PPO | Admitting: Nurse Practitioner

## 2023-11-27 DIAGNOSIS — R109 Unspecified abdominal pain: Secondary | ICD-10-CM | POA: Diagnosis not present

## 2023-11-27 DIAGNOSIS — M0609 Rheumatoid arthritis without rheumatoid factor, multiple sites: Secondary | ICD-10-CM

## 2023-11-27 DIAGNOSIS — R079 Chest pain, unspecified: Secondary | ICD-10-CM

## 2023-11-27 MED ORDER — PREDNISONE 20 MG PO TABS: 40.0000 mg | ORAL_TABLET | Freq: Every day | ORAL | 0 refills | Status: DC

## 2023-11-27 MED ORDER — OMEPRAZOLE 40 MG PO CPDR: 40.0000 mg | DELAYED_RELEASE_CAPSULE | Freq: Every day | ORAL | 0 refills | Status: DC

## 2023-11-27 NOTE — Patient Instructions (Signed)
 It was great to see you!  Stop the motrin and only take tylenol  Start prednisone 2 tablets daily in the morning with food.   Start prilosec 1 tablet daily to help your stomach pain.   I have placed a referral to rheumatology  You have an appointment scheduled at our office tomorrow with Salvatore Decent, NP at 3pm.   If a referral was placed today, you will be contacted for an appointment. Please note that routine referrals can sometimes take up to 3-4 weeks to process. Please call our office if you haven't heard anything after this time frame.  Take care,  Rodman Pickle, NP

## 2023-11-27 NOTE — Assessment & Plan Note (Signed)
 She has been experiencing intermittent chest pain on the left side for the last several months.  She has not had any chest pain the past several days.  She denies associated symptoms, nausea, vomiting, diaphoresis, radiation of pain.  She did have lightheadedness 1 day last week.  Will have her come in for an in person visit tomorrow with Salvatore Decent, NP at 3 PM.  Will give her a work note excusing her today and tomorrow.  Instructed her that if she has chest pain tonight or tomorrow before her visit to go to the ER.

## 2023-11-27 NOTE — Assessment & Plan Note (Signed)
 Chronic, exacerbated.  Will have her stop the Motrin as this is most likely contributing to her stomach pain.  She can continue Tylenol as needed.  Will place referral to rheumatology.  Will also treat with prednisone 40 mg daily x 5.  She can continue using IcyHot and heating pads.  Follow-up as symptoms worsen or do not improve.

## 2023-11-27 NOTE — Progress Notes (Signed)
 [facility] [address] [place] [address] Dept: [phone] Dept Fax: [phone]  Virtual Video Visit  I connected with Olivia Hernandez on 11/27/23 at  3:00 PM EST by a video enabled telemedicine application and verified that I am speaking with the correct person using two identifiers.  Location patient: Home Location provider: Clinic Persons participating in the virtual visit: Patient; Rodman Pickle, NP; Jodelle Green, CMA  I discussed the limitations of evaluation and management by telemedicine and the availability of in person appointments. The patient expressed understanding and agreed to proceed.  Chief Complaint  Patient presents with   Acute Visit    Pt C/O of stomach pain for a couple of days with lightheadedness; no vomiting or diarrhea is present. She also is having severe joint pain with inflammation flare up due to HX of rheumatoid arthritis. Pt used icy hot, heating pad and ibuprofen    SUBJECTIVE:  HPI: Olivia Hernandez is a 48 y.o. female who presents with joint pain, inflammation, and stomach pain.   She has been experiencing right knee and bilateral wrist pain and swelling for the last few months.  She does have a history of rheumatoid arthritis and is not currently taking any prescription medications for this.  She has been taking Motrin/Tylenol combined every 6 hours for the last few months.  She denies fevers, fatigue.  She has also been using IcyHot and heating pads.  She has been experiencing stomach pain for a week.  She states that she is not having any today.  This was associated with lightheadedness 1 day.  She denies nausea, vomiting, diarrhea, blood in her stool.  She has not tried anything over-the-counter for her stomach pain.  She also notes that she has been having intermittent left-sided chest pain for the last several months.  She states that it comes and goes, and will sometimes last around an  hour.  It tends to go away on its own.  No triggering factors.  The pain does not radiate, she denies shortness of breath, diaphoresis, nausea, vomiting.  She states is new which she has not had this prior to a few months ago.   Patient Active Problem List   Diagnosis Date Noted   Stomach pain 11/27/2023   Chest pain 11/27/2023   Routine general medical examination at a health care facility 03/18/2023   Prediabetes 03/18/2023   Mixed hyperlipidemia 03/18/2023   Rheumatoid arthritis of multiple sites with negative rheumatoid factor (HCC) 02/20/2023   Chronic constipation 02/20/2023    Past Surgical History:  Procedure Laterality Date   CESAREAN SECTION  05/01/2004   COLON SURGERY  07/06/2014   Had a colostomy surgery   COLOSTOMY     COLOSTOMY REVERSAL     RECTAL SURGERY     TUBAL LIGATION  05/01/2004    Family History  Problem Relation Age of Onset   Alcohol abuse Mother    COPD Mother    Diabetes Mother    Drug abuse Mother    Heart disease Mother    Hypertension Mother    Stroke Mother    Varicose Veins Mother    Cancer Father        liver   COPD Father    Drug abuse Father    Hypertension Father    Kidney disease Daughter    Drug abuse Son    Early death Son    Breast cancer Neg Hx     Social History  Tobacco Use   Smoking status: Some Days    Current packs/day: 0.00    Types: Cigarettes   Smokeless tobacco: Never   Tobacco comments:    Smoked on and off for years  Vaping Use   Vaping status: Never Used  Substance Use Topics   Alcohol use: Not Currently    Comment: I rarely drink   Drug use: Never     Current Outpatient Medications:    acetaminophen (TYLENOL) 500 MG tablet, Take 1 tablet by mouth every 6 (six) hours as needed., Disp: , Rfl:    nystatin (MYCOSTATIN/NYSTOP) powder, Apply 1 Application topically 3 (three) times daily., Disp: 15 g, Rfl: 0   ofloxacin (FLOXIN) 0.3 % OTIC solution, Place 5 drops into the right ear daily., Disp: 5 mL,  Rfl: 0   omeprazole (PRILOSEC) 40 MG capsule, Take 1 capsule (40 mg total) by mouth daily. For stomach pain, Disp: 30 capsule, Rfl: 0   predniSONE (DELTASONE) 20 MG tablet, Take 2 tablets (40 mg total) by mouth daily with breakfast., Disp: 10 tablet, Rfl: 0  Allergies  Allergen Reactions   Polycillin-N [Ampicillin] Swelling    Tongue Swelled    ROS: See pertinent positives and negatives per HPI.  OBSERVATIONS/OBJECTIVE:  VITALS per patient if applicable: There were no vitals filed for this visit. There is no height or weight on file to calculate BMI.    GENERAL: Alert and oriented. Appears well and in no acute distress.  HEENT: Atraumatic. Conjunctiva clear. No obvious abnormalities on inspection of external nose and ears.  NECK: Normal movements of the head and neck.  LUNGS: On inspection, no signs of respiratory distress. Breathing rate appears normal. No obvious gross SOB, gasping or wheezing, and no conversational dyspnea.  CV: No obvious cyanosis.  MS: Moves all visible extremities without noticeable abnormality.  PSYCH/NEURO: Pleasant and cooperative. No obvious depression or anxiety. Speech and thought processing grossly intact.  ASSESSMENT AND PLAN:  Problem List Items Addressed This Visit       Musculoskeletal and Integument   Rheumatoid arthritis of multiple sites with negative rheumatoid factor (HCC) - Primary   Chronic, exacerbated.  Will have her stop the Motrin as this is most likely contributing to her stomach pain.  She can continue Tylenol as needed.  Will place referral to rheumatology.  Will also treat with prednisone 40 mg daily x 5.  She can continue using IcyHot and heating pads.  Follow-up as symptoms worsen or do not improve.      Relevant Medications   predniSONE (DELTASONE) 20 MG tablet   Other Relevant Orders   Ambulatory referral to Rheumatology     Other   Stomach pain   She endorses stomach pain for a week, however is not currently  having any today.  The pain would sometimes be on her left side and sometimes on the right side of her stomach.  Will have her stop the Motrin as this is most likely contributing to the stomach pain.  Will also have her start Prilosec 40 mg daily.      Chest pain   She has been experiencing intermittent chest pain on the left side for the last several months.  She has not had any chest pain the past several days.  She denies associated symptoms, nausea, vomiting, diaphoresis, radiation of pain.  She did have lightheadedness 1 day last week.  Will have her come in for an in person visit tomorrow with Salvatore Decent, NP at 3 PM.  Will give her a work note excusing her today and tomorrow.  Instructed her that if she has chest pain tonight or tomorrow before her visit to go to the ER.        I discussed the assessment and treatment plan with the patient. The patient was provided an opportunity to ask questions and all were answered. The patient agreed with the plan and demonstrated an understanding of the instructions.   The patient was advised to call back or seek an in-person evaluation if the symptoms worsen or if the condition fails to improve as anticipated.   Gerre Scull, NP

## 2023-11-27 NOTE — Assessment & Plan Note (Signed)
 She endorses stomach pain for a week, however is not currently having any today.  The pain would sometimes be on her left side and sometimes on the right side of her stomach.  Will have her stop the Motrin as this is most likely contributing to the stomach pain.  Will also have her start Prilosec 40 mg daily.

## 2023-11-28 ENCOUNTER — Encounter: Payer: Self-pay | Admitting: Internal Medicine

## 2023-11-28 ENCOUNTER — Ambulatory Visit: Payer: Commercial Managed Care - PPO | Admitting: Internal Medicine

## 2023-11-28 ENCOUNTER — Ambulatory Visit: Payer: Commercial Managed Care - PPO | Attending: Internal Medicine

## 2023-11-28 VITALS — BP 108/68 | HR 81 | Temp 98.5°F | Ht 69.0 in | Wt 145.0 lb

## 2023-11-28 DIAGNOSIS — R109 Unspecified abdominal pain: Secondary | ICD-10-CM | POA: Diagnosis not present

## 2023-11-28 DIAGNOSIS — R079 Chest pain, unspecified: Secondary | ICD-10-CM

## 2023-11-28 DIAGNOSIS — R002 Palpitations: Secondary | ICD-10-CM | POA: Diagnosis not present

## 2023-11-28 DIAGNOSIS — M25561 Pain in right knee: Secondary | ICD-10-CM | POA: Diagnosis not present

## 2023-11-28 LAB — CBC WITH DIFFERENTIAL/PLATELET
Basophils Absolute: 0.1 10*3/uL (ref 0.0–0.1)
Basophils Relative: 0.8 % (ref 0.0–3.0)
Eosinophils Absolute: 0.1 10*3/uL (ref 0.0–0.7)
Eosinophils Relative: 1.3 % (ref 0.0–5.0)
HCT: 35.1 % — ABNORMAL LOW (ref 36.0–46.0)
Hemoglobin: 11.3 g/dL — ABNORMAL LOW (ref 12.0–15.0)
Lymphocytes Relative: 41.3 % (ref 12.0–46.0)
Lymphs Abs: 3.3 10*3/uL (ref 0.7–4.0)
MCHC: 32.3 g/dL (ref 30.0–36.0)
MCV: 89.8 fL (ref 78.0–100.0)
Monocytes Absolute: 0.6 10*3/uL (ref 0.1–1.0)
Monocytes Relative: 7.8 % (ref 3.0–12.0)
Neutro Abs: 3.9 10*3/uL (ref 1.4–7.7)
Neutrophils Relative %: 48.8 % (ref 43.0–77.0)
Platelets: 376 10*3/uL (ref 150.0–400.0)
RBC: 3.91 Mil/uL (ref 3.87–5.11)
RDW: 16 % — ABNORMAL HIGH (ref 11.5–15.5)
WBC: 8 10*3/uL (ref 4.0–10.5)

## 2023-11-28 LAB — COMPREHENSIVE METABOLIC PANEL
ALT: 10 U/L (ref 0–35)
AST: 17 U/L (ref 0–37)
Albumin: 4.1 g/dL (ref 3.5–5.2)
Alkaline Phosphatase: 53 U/L (ref 39–117)
BUN: 10 mg/dL (ref 6–23)
CO2: 27 meq/L (ref 19–32)
Calcium: 8.7 mg/dL (ref 8.4–10.5)
Chloride: 103 meq/L (ref 96–112)
Creatinine, Ser: 0.71 mg/dL (ref 0.40–1.20)
GFR: 100.05 mL/min (ref >60.00)
Glucose, Bld: 93 mg/dL (ref 70–99)
Potassium: 4 meq/L (ref 3.5–5.1)
Sodium: 138 meq/L (ref 135–145)
Total Bilirubin: 0.3 mg/dL (ref 0.2–1.2)
Total Protein: 7.7 g/dL (ref 6.0–8.3)

## 2023-11-28 LAB — TSH: TSH: 1.66 u[IU]/mL (ref 0.35–5.50)

## 2023-11-28 LAB — TROPONIN I (HIGH SENSITIVITY): High Sens Troponin I: 4 ng/L (ref 2–17)

## 2023-11-28 NOTE — Progress Notes (Unsigned)
 EP to read.

## 2023-11-28 NOTE — Progress Notes (Signed)
 [facility] [address] [place] [address] Dept: [phone] Dept Fax: [phone]  Acute Care Office Visit  Subjective:   Olivia Hernandez 1976/09/11 11/28/2023  Chief Complaint  Patient presents with   Follow-up    HPI: Discussed the use of AI scribe software for clinical note transcription with the patient, who gave verbal consent to proceed.  History of Present Illness   The patient presents with intermittent chest pain and palpitations onset a couple months ago per patient. The chest pain, described as sharp, is located on the left mid chest and below left breast. The pain lasts for about an hour and then subsides, only to return a few days later. The palpitations, described as a fast heartbeat, occur once or twice a day, lasting for about half an hour each time. The patient reports that these palpitations often cause her to feel short of breath, but she has not experienced syncope, diaphoresis, nausea, vomiting, or visual changes.  The patient also reports intermittent abdominal pain, which alternates between the left and right sides. This pain has been occurring for a few months and does not seem to be triggered by any specific foods or activities. The patient denies any changes in bowel habits, blood in the stool, or other gastrointestinal symptoms.  The patient is a current smoker, consuming about five cigarettes per day. She has a family history of heart disease, with her mother having died of a heart attack in her 51's. The patient also has a history of high cholesterol.   She also report right knee swelling, which her PCP saw her for yesterday and started a steroid pack which has seemed to start helping per patient. She is requesting a work note to allow her rest her knee for a few days to allow healing. 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         The following portions of the patient's  history were reviewed and updated as appropriate: past medical history, past surgical history, family history, social history, allergies, medications, and problem list.   Patient Active Problem List   Diagnosis Date Noted   Stomach pain 11/27/2023   Chest pain 11/27/2023   Routine general medical examination at a health care facility 03/18/2023   Prediabetes 03/18/2023   Mixed hyperlipidemia 03/18/2023   Rheumatoid arthritis of multiple sites with negative rheumatoid factor (HCC) 02/20/2023   Chronic constipation 02/20/2023   Past Medical History:  Diagnosis Date   Anemia    Arthritis    Blood transfusion without reported diagnosis    Depression    Rectal abscess    Past Surgical History:  Procedure Laterality Date   CESAREAN SECTION  05/01/2004   COLON SURGERY  07/06/2014   Had a colostomy surgery   COLOSTOMY     COLOSTOMY REVERSAL     RECTAL SURGERY     TUBAL LIGATION  05/01/2004   Family History  Problem Relation Age of Onset   Alcohol abuse Mother    COPD Mother    Diabetes Mother    Drug abuse Mother    Heart disease Mother    Hypertension Mother    Stroke Mother    Varicose Veins Mother    Cancer Father        liver   COPD Father    Drug abuse Father    Hypertension Father    Kidney disease Daughter    Drug abuse Son  Early death Son    Breast cancer Neg Hx     Current Outpatient Medications:    acetaminophen (TYLENOL) 500 MG tablet, Take 1 tablet by mouth every 6 (six) hours as needed., Disp: , Rfl:    nystatin (MYCOSTATIN/NYSTOP) powder, Apply 1 Application topically 3 (three) times daily., Disp: 15 g, Rfl: 0   ofloxacin (FLOXIN) 0.3 % OTIC solution, Place 5 drops into the right ear daily., Disp: 5 mL, Rfl: 0   omeprazole (PRILOSEC) 40 MG capsule, Take 1 capsule (40 mg total) by mouth daily. For stomach pain, Disp: 30 capsule, Rfl: 0   predniSONE (DELTASONE) 20 MG tablet, Take 2 tablets (40 mg total) by mouth daily with breakfast., Disp: 10 tablet,  Rfl: 0 Allergies  Allergen Reactions   Polycillin-N [Ampicillin] Swelling    Tongue Swelled     ROS: A complete ROS was performed with pertinent positives/negatives noted in the HPI. The remainder of the ROS are negative.    Objective:   Today's Vitals   11/28/23 1459  BP: 108/68  Pulse: 81  Temp: 98.5 F (36.9 C)  TempSrc: Temporal  SpO2: 99%  Weight: 145 lb (65.8 kg)  Height: 5\' 9"  (1.753 m)    GENERAL: Well-appearing, in NAD. Well nourished.  SKIN: Pink, warm and dry. No rash, lesion, ulceration, or ecchymoses.  NECK: Trachea midline. Full ROM w/o pain or tenderness. No lymphadenopathy.  RESPIRATORY: Chest wall symmetrical. Respirations even and non-labored. Breath sounds clear to auscultation bilaterally.  CARDIAC: S1, S2 present, regular rate and rhythm. Peripheral pulses 2+ bilaterally.  GI: Abdomen soft, non-tender. Normoactive bowel sounds. No rebound tenderness. No hepatomegaly or splenomegaly. No CVA tenderness.  MSK: Muscle tone and strength appropriate for age. Mild swelling to anterior surface of right knee EXTREMITIES: Without clubbing, cyanosis, or edema.  NEUROLOGIC: No motor or sensory deficits. Steady, even gait.  PSYCH/MENTAL STATUS: Alert, oriented x 3. Cooperative, appropriate mood and affect.   EKG RESULT: EKG tracing is personally reviewed.   EKG: normal EKG, normal sinus rhythm.   No results found for any visits on 11/28/23.    Assessment & Plan:  Assessment and Plan    Chest Pain and Palpitations Intermittent chest pain and palpitations for several months. No associated syncope, diaphoresis, nausea, vomiting, or visual changes. Pain is sharp and located in the left chest. Palpitations occur daily and last for approximately 30 minutes. Family history of heart disease (mother died of heart attack). -Order long-term heart monitor (Zio patch) for two weeks to evaluate heart rhythm during episodes of chest pain and palpitations. -Order repeat blood  work including heart enzyme to evaluate for cardiac stress. -Consider chest x-ray and cardiology referral if heart monitor results are negative.   Abdominal Pain  - nonspecific pain. History of constipation. No further symptoms. Continue to monitor at this time and do further workup if indicated after pursuing the chief complaint of CP.  - obtaining CBC, CMP    Knee Swelling Current knee swelling, on steroids. -Provide work note for rest until Monday.       Advised patient if chest pain persists or worsens, or palpitations persist and do not alleviate she needs to seek emergent care at the emergency room.  Patient verbalized understanding and in agreement with plan of care  No orders of the defined types were placed in this encounter.  Orders Placed This Encounter  Procedures   CBC with Differential/Platelet   Comprehensive metabolic panel   TSH   LONG TERM MONITOR (3-14  DAYS)    Standing Status:   Future    Number of Occurrences:   1    Expiration Date:   11/27/2024    Where should this test be performed?:   [facility]    Does the patient have an implanted cardiac device?:   No    Prescribed days of wear:   14    Type of enrollment:   Home Enrollment    Vendor::   Zio   EKG 12-Lead   Lab Orders         CBC with Differential/Platelet         Comprehensive metabolic panel         TSH     No images are attached to the encounter or orders placed in the encounter.  Return if symptoms worsen or fail to improve.   Salvatore Decent, FNP

## 2023-11-29 ENCOUNTER — Other Ambulatory Visit: Payer: Self-pay | Admitting: Internal Medicine

## 2023-11-29 DIAGNOSIS — D649 Anemia, unspecified: Secondary | ICD-10-CM

## 2023-12-03 ENCOUNTER — Encounter: Payer: Self-pay | Admitting: Internal Medicine

## 2023-12-03 ENCOUNTER — Other Ambulatory Visit: Payer: Commercial Managed Care - PPO

## 2023-12-03 DIAGNOSIS — R079 Chest pain, unspecified: Secondary | ICD-10-CM

## 2023-12-03 DIAGNOSIS — D649 Anemia, unspecified: Secondary | ICD-10-CM

## 2023-12-03 DIAGNOSIS — R002 Palpitations: Secondary | ICD-10-CM | POA: Diagnosis not present

## 2023-12-03 DIAGNOSIS — R195 Other fecal abnormalities: Secondary | ICD-10-CM

## 2023-12-04 LAB — IRON,TIBC AND FERRITIN PANEL
%SAT: 32 % (ref 16–45)
Ferritin: 9 ng/mL — ABNORMAL LOW (ref 16–232)
Iron: 119 ug/dL (ref 40–190)
TIBC: 370 ug/dL (ref 250–450)

## 2023-12-05 ENCOUNTER — Encounter: Payer: Self-pay | Admitting: Internal Medicine

## 2023-12-09 NOTE — Telephone Encounter (Signed)
 Referral placed, left voicemail informing patient.

## 2023-12-09 NOTE — Telephone Encounter (Signed)
 Yes , please place the referral for patient and let her know

## 2023-12-12 ENCOUNTER — Encounter: Payer: Self-pay | Admitting: Physician Assistant

## 2023-12-24 ENCOUNTER — Other Ambulatory Visit: Payer: Self-pay | Admitting: Nurse Practitioner

## 2023-12-24 NOTE — Telephone Encounter (Signed)
 Requesting: OMEPRAZOLE 40MG  CAPSULES  Last Visit: 03/18/2023 Next Visit: Visit date not found Last Refill: 11/27/2023  Please Advise

## 2024-01-07 ENCOUNTER — Encounter: Payer: Self-pay | Admitting: Internal Medicine

## 2024-01-29 ENCOUNTER — Encounter: Payer: Self-pay | Admitting: Physician Assistant

## 2024-01-30 ENCOUNTER — Ambulatory Visit: Admitting: Physician Assistant

## 2024-02-11 ENCOUNTER — Encounter: Payer: Self-pay | Admitting: Internal Medicine

## 2024-02-11 DIAGNOSIS — M0609 Rheumatoid arthritis without rheumatoid factor, multiple sites: Secondary | ICD-10-CM

## 2024-03-09 NOTE — Telephone Encounter (Signed)
 Copied from CRM [id number]. Topic: Referral - Status >> Mar 09, 2024  9:44 AM Artemio Larry wrote: Reason for CRM: [facility] calling to let Rheba Cedar know that patient referral is being discarded due to patient telling their office that she has been seen by a Rheumatologist already.

## 2024-03-11 ENCOUNTER — Encounter: Payer: Self-pay | Admitting: Internal Medicine

## 2024-03-12 ENCOUNTER — Ambulatory Visit: Payer: Self-pay

## 2024-03-12 NOTE — Telephone Encounter (Signed)
 FYI Only or Action Required?: FYI only for provider  Patient was last seen in primary care on 11/28/2023 by Gavin Kast, FNP. Called Nurse Triage reporting Dysuria. Symptoms began several days ago. Interventions attempted: Rest, hydration, or home remedies. Symptoms are: unchanged.  Triage Disposition: See Physician Within 24 Hours  Patient/caregiver understands and will follow disposition?: Yes    Copied from CRM [id number]. Topic: Clinical - Red Word Triage >> Mar 12, 2024  4:45 PM Olivia Hernandez wrote: Red Word that prompted transfer to Nurse Triage: Burning with urination.   ----------------------------------------------------------------------- From previous Reason for Contact - Scheduling: Patient/patient representative is calling to schedule an appointment. Refer to attachments for appointment information. Reason for Disposition  Urinating more frequently than usual (i.e., frequency)  Answer Assessment - Initial Assessment Questions RN advised pt should be seen within 24 hours. Pt states she works every day from 0500 to 1730, unable to come into the office. Pt states she will go to UC this evening.     1. SYMPTOM: What's the main symptom you're concerned about? (e.g., frequency, incontinence)     Dysuria, frequency with minimal urinary output  2. ONSET: When did the dysuria start?     2 days ago  3. PAIN: Is there any pain? If Yes, ask: How bad is it? (Scale: 1-10; mild, moderate, severe)     10/10 4. CAUSE: What do you think is causing the symptoms?     UTI  5. OTHER SYMPTOMS: Do you have any other symptoms? (e.g., blood in urine, fever, flank pain, pain with urination)   Pt states her rheumatologist put her on steroids   Denies back pain, flank pain, abdominal pain  Denies hematuria  Denies fever or chills; denies N/V/D  Protocols used: Urinary Symptoms-A-AH

## 2024-03-13 NOTE — Telephone Encounter (Signed)
 Noted. Patient going to urgent care.

## 2024-03-13 NOTE — Telephone Encounter (Signed)
 Left message for patient to return call.

## 2024-03-16 NOTE — Telephone Encounter (Signed)
 Left message for patient to return call.

## 2024-03-20 NOTE — Telephone Encounter (Signed)
 Left message for patient to return call.

## 2024-03-23 ENCOUNTER — Ambulatory Visit: Admitting: Nurse Practitioner

## 2024-03-23 ENCOUNTER — Encounter: Payer: Self-pay | Admitting: Nurse Practitioner

## 2024-03-23 VITALS — BP 102/64 | HR 91 | Temp 97.1°F | Ht 69.0 in | Wt 139.0 lb

## 2024-03-23 DIAGNOSIS — R300 Dysuria: Secondary | ICD-10-CM | POA: Diagnosis not present

## 2024-03-23 DIAGNOSIS — S161XXA Strain of muscle, fascia and tendon at neck level, initial encounter: Secondary | ICD-10-CM | POA: Diagnosis not present

## 2024-03-23 LAB — POCT URINALYSIS DIPSTICK
Bilirubin, UA: POSITIVE
Blood, UA: NEGATIVE
Glucose, UA: NEGATIVE
Ketones, UA: NEGATIVE
Leukocytes, UA: NEGATIVE
Nitrite, UA: NEGATIVE
Protein, UA: POSITIVE — AB
Spec Grav, UA: 1.025 (ref 1.010–1.025)
Urobilinogen, UA: 0.2 U/dL
pH, UA: 6 (ref 5.0–8.0)

## 2024-03-23 MED ORDER — CYCLOBENZAPRINE HCL 10 MG PO TABS: 10.0000 mg | ORAL_TABLET | Freq: Three times a day (TID) | ORAL | 0 refills | Status: AC | PRN

## 2024-03-23 MED ORDER — NAPROXEN 500 MG PO TABS: 500.0000 mg | ORAL_TABLET | Freq: Two times a day (BID) | ORAL | 0 refills | Status: DC

## 2024-03-23 NOTE — Assessment & Plan Note (Signed)
 Upper back and neck strain is causing significant pain and sleep disturbances, with no history of trauma or heavy lifting. Prescribe naproxen 500mg  twice daily with food. Prescribe Flexeril 10mg  at bedtime initially, up to three times daily as needed. Instruct on gentle stretching exercises, avoid if painful. Provide work note for missed work and recommend taking the next day off.

## 2024-03-23 NOTE — Patient Instructions (Signed)
 It was great to see you!  Start naproxen twice a day with food   Start flexeril 3 times a day as needed for neck pain - this may make you sleepy   Start the attached stretches daily  Let's follow-up if your symptoms worsen or don't improve  Take care,  Tinnie Harada, NP

## 2024-03-23 NOTE — Progress Notes (Signed)
 Acute Office Visit  Subjective:     Patient ID: Olivia Hernandez, female    DOB: 1976/06/13, 48 y.o.   MRN: [id number]  Chief Complaint  Patient presents with   Flank Pain    With burning with urination last week, sharp upper back pain    HPI Discussed the use of AI scribe software for clinical note transcription with the patient, who gave verbal consent to proceed.  History of Present Illness   Olivia Hernandez is a 48 year old female who presents with upper back pain.  She began experiencing pain behind her left shoulder blade four days ago, radiating slightly to her neck. The pain worsens with neck movement or getting up from bed, disrupting her sleep. She describes the pain as severe with certain movements. Tylenol provides minimal relief. The pain is localized to the upper back and neck without significant radiation elsewhere.  She works as a Teacher, early years/pre, involving significant physical activity. There is no pain with pressure on the back or shoulder area, and she can move her shoulder without pain. Discomfort occurs when turning her neck or tilting her head.  She did also have dysuria and low back pain last week. She did a visit with an online telehealth provider and was given macrobid for a UTI. She states that these symptoms have resolved.      ROS See pertinent positives and negatives per HPI.     Objective:    BP 102/64 (BP Location: Left Arm, Patient Position: Sitting, Cuff Size: Small)   Pulse 91   Temp (!) 97.1 F (36.2 C)   Ht 5' 9 (1.753 m)   Wt 139 lb (63 kg)   LMP 02/22/2024 (Approximate)   SpO2 99%   BMI 20.53 kg/m  BP Readings from Last 3 Encounters:  03/23/24 102/64  11/28/23 108/68  03/18/23 108/72   Wt Readings from Last 3 Encounters:  03/23/24 139 lb (63 kg)  11/28/23 145 lb (65.8 kg)  03/18/23 150 lb 9.6 oz (68.3 kg)      Physical Exam Vitals and nursing note reviewed.  Constitutional:      General: She is not in acute distress.     Appearance: Normal appearance.  HENT:     Head: Normocephalic.   Eyes:     Conjunctiva/sclera: Conjunctivae normal.   Pulmonary:     Effort: Pulmonary effort is normal.  Abdominal:     Tenderness: There is no right CVA tenderness or left CVA tenderness.   Musculoskeletal:        General: No swelling or tenderness.     Cervical back: Normal range of motion.     Comments: Cervical ROM limited due to pain, left shoulder ROM normal   Skin:    General: Skin is warm.   Neurological:     General: No focal deficit present.     Mental Status: She is alert and oriented to person, place, and time.   Psychiatric:        Mood and Affect: Mood normal.        Behavior: Behavior normal.        Thought Content: Thought content normal.        Judgment: Judgment normal.      Assessment & Plan:   Problem List Items Addressed This Visit       Musculoskeletal and Integument   Cervical strain - Primary   Upper back and neck strain is causing significant pain and sleep disturbances, with no history  of trauma or heavy lifting. Prescribe naproxen 500mg  twice daily with food. Prescribe Flexeril 10mg  at bedtime initially, up to three times daily as needed. Instruct on gentle stretching exercises, avoid if painful. Provide work note for missed work and recommend taking the next day off.       Other Visit Diagnoses       Dysuria       Symptoms improved after macrobid. U/A negative for leukocytes and nitrites.   Relevant Orders   POCT urinalysis dipstick (Completed)       Meds ordered this encounter  Medications   naproxen (NAPROSYN) 500 MG tablet    Sig: Take 1 tablet (500 mg total) by mouth 2 (two) times daily with a meal.    Dispense:  30 tablet    Refill:  0   cyclobenzaprine (FLEXERIL) 10 MG tablet    Sig: Take 1 tablet (10 mg total) by mouth 3 (three) times daily as needed for muscle spasms.    Dispense:  30 tablet    Refill:  0    Return if symptoms worsen or fail to  improve.  Tinnie DELENA Harada, NP

## 2024-03-25 ENCOUNTER — Encounter: Payer: Self-pay | Admitting: Physician Assistant

## 2024-03-25 ENCOUNTER — Ambulatory Visit: Admitting: Physician Assistant

## 2024-03-25 VITALS — BP 112/70 | HR 94 | Ht 69.0 in | Wt 142.6 lb

## 2024-03-25 DIAGNOSIS — D509 Iron deficiency anemia, unspecified: Secondary | ICD-10-CM

## 2024-03-25 DIAGNOSIS — M0609 Rheumatoid arthritis without rheumatoid factor, multiple sites: Secondary | ICD-10-CM | POA: Diagnosis not present

## 2024-03-25 DIAGNOSIS — L732 Hidradenitis suppurativa: Secondary | ICD-10-CM

## 2024-03-25 DIAGNOSIS — R195 Other fecal abnormalities: Secondary | ICD-10-CM | POA: Diagnosis not present

## 2024-03-25 DIAGNOSIS — K5909 Other constipation: Secondary | ICD-10-CM | POA: Diagnosis not present

## 2024-03-25 DIAGNOSIS — Z860100 Personal history of colon polyps, unspecified: Secondary | ICD-10-CM

## 2024-03-25 MED ORDER — NA SULFATE-K SULFATE-MG SULF 17.5-3.13-1.6 GM/177ML PO SOLN: 1.0000 | Freq: Once | ORAL | 0 refills | Status: AC

## 2024-03-25 NOTE — Patient Instructions (Addendum)
 Miralax is an osmotic laxative.  It only brings more water into the stool.  This is safe to take daily.  Can take up to 17 gram of miralax twice a day.  Mix with juice or coffee.  Start 1 capful at night for 3-4 days and reassess your response in 3-4 days.  You can increase and decrease the dose based on your response.  Remember, it can take up to 3-4 days to take effect OR for the effects to wear off.   I often pair this with benefiber in the morning to help assure the stool is not too loose.   Toileting tips to help with your constipation - Drink at least 64-80 ounces of water/liquid per day. - Establish a time to try to move your bowels every day.  For many people, this is after a cup of coffee or after a meal such as breakfast. - Sit all of the way back on the toilet keeping your back fairly straight and while sitting up, try to rest the tops of your forearms on your upper thighs.   - Raising your feet with a step stool/squatty potty can be helpful to improve the angle that allows your stool to pass through the rectum. - Relax the rectum feeling it bulge toward the toilet water.  If you feel your rectum raising toward your body, you are contracting rather than relaxing. - Breathe in and slowly exhale. Belly breath by expanding your belly towards your belly button. Keep belly expanded as you gently direct pressure down and back to the anus.  A low pitched GRRR sound can assist with increasing intra-abdominal pressure.  (Can also trying to blow on a pinwheel and make it move, this helps with the same belly breathing) - Repeat 3-4 times. If unsuccessful, contract the pelvic floor to restore normal tone and get off the toilet.  Avoid excessive straining. - To reduce excessive wiping by teaching your anus to normally contract, place hands on outer aspect of knees and resist knee movement outward.  Hold 5-10 second then place hands just inside of knees and resist inward movement of knees.  Hold 5  seconds.  Repeat a few times each way.  Go to the ER if unable to pass gas, severe AB pain, unable to hold down food, any shortness of breath of chest pain.  Here some information about pelvic floor dysfunction. This may be contributing to some of your symptoms. We will continue with our evaluation but I do want you to consider adding on fiber supplement with low-dose MiraLAX daily. We could also refer to pelvic floor physical therapy.   Pelvic Floor Dysfunction, Female Pelvic floor dysfunction (PFD) is a condition that results when the group of muscles and connective tissues that support the organs in the pelvis (pelvic floor muscles) do not work well. These muscles and their connections form a sling that supports the colon and bladder. In women, they also support the uterus. PFD causes pelvic floor muscles to be too weak, too tight, or both. In PFD, muscle movements are not coordinated. This may cause bowel or bladder problems. It may also cause pain. What are the causes? This condition may be caused by an injury to the pelvic area or by a weakening of pelvic muscles. This often results from pregnancy and childbirth or other types of strain. In many cases, the exact cause is not known. What increases the risk? The following factors may make you more likely to develop this condition:  Having chronic bladder tissue inflammation (interstitial cystitis). Being an older person. Being overweight. History of radiation treatment for cancer in the pelvic region. Previous pelvic surgery, such as removal of the uterus (hysterectomy). What are the signs or symptoms? Symptoms of this condition vary and may include: Bladder symptoms, such as: Trouble starting urination and emptying the bladder. Frequent urinary tract infections. Leaking urine when coughing, laughing, or exercising (stress incontinence). Having to pass urine urgently or frequently. Pain when passing urine. Bowel symptoms, such  as: Constipation. Urgent or frequent bowel movements. Incomplete bowel movements. Painful bowel movements. Leaking stool or gas. Unexplained genital or rectal pain. Genital or rectal muscle spasms. Low back pain. Other symptoms may include: A heavy, full, or aching feeling in the vagina. A bulge that protrudes into the vagina. Pain during or after sex. How is this diagnosed? This condition may be diagnosed based on: Your symptoms and medical history. A physical exam. During the exam, your health care provider may check your pelvic muscles for tightness, spasm, pain, or weakness. This may include a rectal exam and a pelvic exam. In some cases, you may have diagnostic tests, such as: Electrical muscle function tests. Urine flow testing. X-ray tests of bowel function. Ultrasound of the pelvic organs. How is this treated? Treatment for this condition depends on the symptoms. Treatment options include: Physical therapy. This may include Kegel exercises to help relax or strengthen the pelvic floor muscles. Biofeedback. This type of therapy provides feedback on how tight your pelvic floor muscles are so that you can learn to control them. Internal or external massage therapy. A treatment that involves electrical stimulation of the pelvic floor muscles to help control pain (transcutaneous electrical nerve stimulation, or TENS). Sound wave therapy (ultrasound) to reduce muscle spasms. Medicines, such as: Muscle relaxants. Bladder control medicines. Surgery to reconstruct or support pelvic floor muscles may be an option if other treatments do not help. Follow these instructions at home: Activity Do your usual activities as told by your health care provider. Ask your health care provider if you should modify any activities. Do pelvic floor strengthening or relaxing exercises at home as told by your physical therapist. Lifestyle Maintain a healthy weight. Eat foods that are high in fiber,  such as beans, whole grains, and fresh fruits and vegetables. Limit foods that are high in fat and processed sugars, such as fried or sweet foods. Manage stress with relaxation techniques such as yoga or meditation. General instructions If you have problems with leakage: Use absorbable pads or wear padded underwear. Wash frequently with mild soap. Keep your genital and anal area as clean and dry as possible. Ask your health care provider if you should try a barrier cream to prevent skin irritation. Take warm baths to relieve pelvic muscle tension or spasms. Take over-the-counter and prescription medicines only as told by your health care provider. Keep all follow-up visits. How is this prevented? The cause of PFD is not always known, but there are a few things you can do to reduce the risk of developing this condition, including: Staying at a healthy weight. Getting regular exercise. Managing stress. Contact a health care provider if: Your symptoms are not improving with home care. You have signs or symptoms of PFD that get worse at home. You develop new signs or symptoms. You have signs of a urinary tract infection, such as: Fever. Chills. Increased urinary frequency. A burning feeling when urinating. You have not had a bowel movement in 3 days (constipation). Summary  Pelvic floor dysfunction results when the muscles and connective tissues in your pelvic floor do not work well. These muscles and their connections form a sling that supports your colon and bladder. In women, they also support the uterus. PFD may be caused by an injury to the pelvic area or by a weakening of pelvic muscles. PFD causes pelvic floor muscles to be too weak, too tight, or a combination of both. Symptoms may vary from person to person. In most cases, PFD can be treated with physical therapies and medicines. Surgery may be an option if other treatments do not help. This information is not intended to replace  advice given to you by your health care provider. Make sure you discuss any questions you have with your health care provider. Document Revised: 01/25/2021 Document Reviewed: 01/25/2021 Elsevier Patient Education  2022 [employer]  You have been scheduled for a colonoscopy. Please follow written instructions given to you at your visit today.   If you use inhalers (even only as needed), please bring them with you on the day of your procedure.  DO NOT TAKE 7 DAYS PRIOR TO TEST- Trulicity (dulaglutide) Ozempic, Wegovy (semaglutide) Mounjaro (tirzepatide) Bydureon Bcise (exanatide extended release)  DO NOT TAKE 1 DAY PRIOR TO YOUR TEST Rybelsus (semaglutide) Adlyxin (lixisenatide) Victoza (liraglutide) Byetta (exanatide) ___________________________________________________________________________    Due to recent changes in healthcare laws, you may see the results of your imaging and laboratory studies on MyChart before your provider has had a chance to review them.  We understand that in some cases there may be results that are confusing or concerning to you. Not all laboratory results come back in the same time frame and the provider may be waiting for multiple results in order to interpret others.  Please give us  48 hours in order for your provider to thoroughly review all the results before contacting the office for clarification of your results.    I appreciate the  opportunity to care for you  Thank You   [facility]

## 2024-03-25 NOTE — Progress Notes (Signed)
 03/25/2024 Olivia Hernandez [id number] [birth year]  Referring provider: Nedra Tinnie LABOR, NP Primary GI doctor: Dr. San  ASSESSMENT AND PLAN:   Personal history of colon polyps/history of positive cologuard No family history of colon cancer 11/15/2021 colonoscopy in [place] for positive Cologuard, preparation of colon was poor, abnormal Peri anal exam, surgical anastomosis at site of prior ostomy healthy-appearing mucosa, 4 polyps 3 to 5 mm sigmoid colon, 4 polyps 2 to 4 mm in the rectum.    Polyps hyperplastic Recall colonoscopy 1 year 2-day prep at [facility] for further evaluation We have discussed the risks of bleeding, infection, perforation, medication reactions, and remote risk of death associated with colonoscopy. All questions were answered and the patient acknowledges these risk and wishes to proceed. 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  11/28/2023  HGB 11.3 MCV 89.8 Platelets 376.0 12/03/2023 Iron 119 Ferritin 9  Recent Labs    11/28/23 1546  HGB 11.3*  - get colonoscopy, likely menses has been life long, no UGI symptoms - start on oral iron, consider infusions if unable to tolerate  Chronic constipation - Increase fiber/ water intake, decrease caffeine, increase activity level. - continue miralax as needed, information given  Rheumatoid arthritis May be starting on  remicaide  Patient Care Team: Nedra Tinnie LABOR, NP as PCP - General (Internal Medicine)  HISTORY OF PRESENT ILLNESS: 48 y.o. female with a past medical history listed below presents for evaluation of colonoscopy.   Discussed the use of AI scribe software for clinical note transcription with the patient, who gave verbal consent to proceed.  History of Present Illness   Olivia Hernandez is a 48 year old female who presents for follow-up after a colonoscopy for a positive Cologuard test.  She underwent a colonoscopy in 2023 due to a positive Cologuard test, during which polyps were removed. The procedure was suboptimal due to excessive stool, and she has not had a recall colonoscopy since then. No family history of colon cancer is reported.  She has a history of hidradenitis suppurativa, which led to a diverting colostomy in 2014 due to extensive perineal and perirectal involvement. She underwent multiple surgeries, including a revision of the colostomy in 2017. Occasional mild hidradenitis symptoms occur under her arms, but these are less severe than her previous rectal involvement. She is not currently on any medications for hidradenitis suppurativa.  She experiences occasional constipation, which she manages with Miralax and increased fruit intake. No dark stools, blood in the stool, heartburn, reflux, nausea, vomiting, significant abdominal pain, bloating, or fecal incontinence. She drinks a lot of water and sometimes wakes up at night to urinate.  Her menstrual periods are heavy, lasting about four to five days, requiring pad changes every two to three hours during the first three days. She has a history of anemia, which she has experienced most of her life, and she notices increased fatigue around her menstrual periods.  She reports occasional stomach pain but denies significant  abdominal pain. She tans occasionally, noting that sun exposure helps her arthritis symptoms. She recently moved from  [place] .      She  reports that she has been smoking cigarettes. She has never used smokeless tobacco. She reports that she does not currently use alcohol. She reports that she does not use drugs.  RELEVANT GI HISTORY, IMAGING AND LABS: Results   LABS Hemoglobin: Anemia (11/2023)  DIAGNOSTIC Colonoscopy: Polyps removed, significant stool presence (2023)      CBC    Component Value Date/Time   WBC 8.0 11/28/2023 1546   RBC 3.91 11/28/2023 1546   HGB 11.3 (L) 11/28/2023 1546   HCT 35.1 (L) 11/28/2023 1546   PLT 376.0 11/28/2023 1546   MCV 89.8 11/28/2023 1546   MCH 30.6 02/21/2022 1854   MCHC 32.3 11/28/2023 1546   RDW 16.0 (H) 11/28/2023 1546   LYMPHSABS 3.3 11/28/2023 1546   MONOABS 0.6 11/28/2023 1546   EOSABS 0.1 11/28/2023 1546   BASOSABS 0.1 11/28/2023 1546   Recent Labs    11/28/23 1546  HGB 11.3*    CMP     Component Value Date/Time   NA 138 11/28/2023 1546   K 4.0 11/28/2023 1546   CL 103 11/28/2023 1546   CO2 27 11/28/2023 1546   GLUCOSE 93 11/28/2023 1546   BUN 10 11/28/2023 1546   CREATININE 0.71 11/28/2023 1546   CALCIUM 8.7 11/28/2023 1546   PROT 7.7 11/28/2023 1546   ALBUMIN 4.1 11/28/2023 1546   AST 17 11/28/2023 1546   ALT 10 11/28/2023 1546   ALKPHOS 53 11/28/2023 1546   BILITOT 0.3 11/28/2023 1546   GFRNONAA >60 02/21/2022 1854      Latest Ref Rng & Units 11/28/2023    3:46 PM 03/18/2023    3:59 PM 02/21/2022    6:54 PM  Hepatic Function  Total Protein 6.0 - 8.3 g/dL 7.7  7.3  7.1   Albumin 3.5 - 5.2 g/dL 4.1  4.1  4.0   AST 0 - 37 U/L 17  14  19    ALT 0 - 35 U/L 10  10  15    Alk Phosphatase 39 - 117 U/L 53  54  46   Total Bilirubin 0.2 - 1.2 mg/dL 0.3  0.4  0.4       Current Medications:      Current Outpatient Medications (Analgesics):    acetaminophen (TYLENOL) 500 MG tablet, Take 1 tablet by mouth every 6 (six) hours as needed.   naproxen (NAPROSYN) 500 MG tablet, Take 1 tablet (500 mg total) by mouth 2 (two) times  daily with a meal.   Current Outpatient Medications (Other):    cyclobenzaprine (FLEXERIL) 10 MG tablet, Take 1 tablet (10 mg total) by mouth 3 (three) times daily as needed for muscle spasms.   Na Sulfate-K Sulfate-Mg Sulfate concentrate (SUPREP) 17.5-3.13-1.6 GM/177ML SOLN, Take 1 kit (354 mLs total) by mouth once for 1 dose.   omeprazole  (PRILOSEC) 40 MG capsule, TAKE 1 CAPSULE(40 MG) BY MOUTH DAILY FOR STOMACH PAIN   nystatin  (MYCOSTATIN /NYSTOP ) powder, Apply 1 Application topically 3 (three) times daily. (Patient not taking: Reported on 03/23/2024)   ofloxacin  (FLOXIN ) 0.3 % OTIC solution, Place 5 drops into the right ear daily. (Patient not taking: Reported on 03/23/2024)  Medical History:  Past Medical History:  Diagnosis Date   Anemia    Arthritis    Blood transfusion without reported diagnosis    Depression    Rectal abscess    Allergies:  Allergies  Allergen Reactions   Polycillin-N [Ampicillin] Swelling    Tongue Swelled     Surgical History:  She  has a past surgical history that includes Rectal surgery; Colostomy; Colostomy reversal; Cesarean section (05/01/2004); Tubal ligation (05/01/2004); and Colon surgery (07/06/2014). Family History:  Her family history includes Alcohol abuse in her mother; COPD in her father and mother; Cancer in her father; Diabetes in her mother; Drug abuse in her father, mother, and son; Early death in her son; Heart disease in her mother; Hypertension in her father and mother; Kidney disease in her daughter; Stroke in her mother; Varicose Veins in her mother.  REVIEW OF SYSTEMS  : All other systems reviewed and negative except where noted in the History of Present Illness.  PHYSICAL EXAM: BP 112/70   Pulse 94   Ht 5' 9 (1.753 m)   Wt 142 lb 9.6 oz (64.7 kg)   LMP 02/23/2024 (Approximate)   BMI 21.06 kg/m  Physical Exam   GENERAL APPEARANCE: Well nourished, in no apparent distress. HEENT: No cervical lymphadenopathy, unremarkable  thyroid, sclerae anicteric, conjunctiva pink. RESPIRATORY: Respiratory effort normal, breath sounds equal bilaterally without rales, rhonchi, wheezing. Lungs clear to auscultation bilaterally. CARDIO: Regular rate and rhythm with no murmurs, rubs, or gallops, peripheral pulses intact. ABDOMEN: Soft, non-distended, active bowel sounds in all four quadrants, no tenderness to palpation, no rebound, no mass appreciated. RECTAL: Declines. MUSCULOSKELETAL: Full range of motion, normal gait, without edema. SKIN: Dry, intact without rashes or lesions. No jaundice. NEURO: Alert, oriented, no focal deficits. PSYCH: Cooperative, normal mood and affect.      Alan JONELLE Coombs, PA-C 11:28 AM

## 2024-04-04 ENCOUNTER — Other Ambulatory Visit: Payer: Self-pay | Admitting: Nurse Practitioner

## 2024-04-07 NOTE — Telephone Encounter (Signed)
 Requesting: NAPROXEN  500MG  TABLETS  Last Visit: 03/23/2024 Next Visit: Visit date not found Last Refill: 03/23/2024  Please Advise

## 2024-04-08 ENCOUNTER — Encounter: Payer: Self-pay | Admitting: Gastroenterology

## 2024-04-08 ENCOUNTER — Ambulatory Visit: Admitting: Gastroenterology

## 2024-04-08 VITALS — BP 126/80 | HR 74 | Temp 97.3°F | Resp 10 | Ht 69.0 in | Wt 142.0 lb

## 2024-04-08 DIAGNOSIS — K9189 Other postprocedural complications and disorders of digestive system: Secondary | ICD-10-CM | POA: Diagnosis not present

## 2024-04-08 DIAGNOSIS — Z1211 Encounter for screening for malignant neoplasm of colon: Secondary | ICD-10-CM

## 2024-04-08 DIAGNOSIS — K641 Second degree hemorrhoids: Secondary | ICD-10-CM

## 2024-04-08 DIAGNOSIS — K644 Residual hemorrhoidal skin tags: Secondary | ICD-10-CM | POA: Diagnosis not present

## 2024-04-08 DIAGNOSIS — K529 Noninfective gastroenteritis and colitis, unspecified: Secondary | ICD-10-CM | POA: Diagnosis not present

## 2024-04-08 DIAGNOSIS — K635 Polyp of colon: Secondary | ICD-10-CM | POA: Diagnosis not present

## 2024-04-08 DIAGNOSIS — Z860100 Personal history of colon polyps, unspecified: Secondary | ICD-10-CM

## 2024-04-08 DIAGNOSIS — K648 Other hemorrhoids: Secondary | ICD-10-CM

## 2024-04-08 DIAGNOSIS — D125 Benign neoplasm of sigmoid colon: Secondary | ICD-10-CM

## 2024-04-08 DIAGNOSIS — R195 Other fecal abnormalities: Secondary | ICD-10-CM

## 2024-04-08 MED ORDER — SODIUM CHLORIDE 0.9 % IV SOLN: 500.0000 mL | INTRAVENOUS | Status: AC

## 2024-04-08 NOTE — Progress Notes (Signed)
 Agree with the assessment and plan as outlined by Quentin Mulling, PA-C. ? ?Keron Neenan, DO, FACG ? ?

## 2024-04-08 NOTE — Progress Notes (Signed)
 Called to room to assist during endoscopic procedure.  Patient ID and intended procedure confirmed with present staff. Received instructions for my participation in the procedure from the performing physician.

## 2024-04-08 NOTE — Patient Instructions (Addendum)
 YOU HAD AN ENDOSCOPIC PROCEDURE TODAY AT THE [facility]:   Refer to the procedure report that was given to you for any specific questions about what was found during the examination.  If the procedure report does not answer your questions, please call your gastroenterologist to clarify.  If you requested that your care partner not be given the details of your procedure findings, then the procedure report has been included in a sealed envelope for you to review at your convenience later.  YOU SHOULD EXPECT: Some feelings of bloating in the abdomen. Passage of more gas than usual.  Walking can help get rid of the air that was put into your GI tract during the procedure and reduce the bloating. If you had a lower endoscopy (such as a colonoscopy or flexible sigmoidoscopy) you may notice spotting of blood in your stool or on the toilet paper. If you underwent a bowel prep for your procedure, you may not have a normal bowel movement for a few days.  Please Note:  You might notice some irritation and congestion in your nose or some drainage.  This is from the oxygen used during your procedure.  There is no need for concern and it should clear up in a day or so.  SYMPTOMS TO REPORT IMMEDIATELY:  Following lower endoscopy (colonoscopy or flexible sigmoidoscopy):  Excessive amounts of blood in the stool  Significant tenderness or worsening of abdominal pains  Swelling of the abdomen that is new, acute  Fever of 100F or higher   For urgent or emergent issues, a gastroenterologist can be reached at any hour by calling [phone]. Do not use MyChart messaging for urgent concerns.    DIET:  We do recommend a small meal at first, but then you may proceed to your regular diet.  Drink plenty of fluids but you should avoid alcoholic beverages for 24 hours.  MEDICATIONS: Continue present medications.  Please see handouts given to you by your recovery nurse: Polyps, Hemorrhoids.  FOLLOW UP:  Await pathology results. Repeat colonoscopy for surveillance based on pathology results. Return to GI office as needed.  Thank you for allowing us  to provide for your healthcare needs today.  ACTIVITY:  You should plan to take it easy for the rest of today and you should NOT DRIVE or use heavy machinery until tomorrow (because of the sedation medicines used during the test).    FOLLOW UP: Our staff will call the number listed on your records the next business day following your procedure.  We will call around 7:15- 8:00 am to check on you and address any questions or concerns that you may have regarding the information given to you following your procedure. If we do not reach you, we will leave a message.     If any biopsies were taken you will be contacted by phone or by letter within the next 1-3 weeks.  Please call us  at [phone] if you have not heard about the biopsies in 3 weeks.    SIGNATURES/CONFIDENTIALITY: You and/or your care partner have signed paperwork which will be entered into your electronic medical record.  These signatures attest to the fact that that the information above on your After Visit Summary has been reviewed and is understood.  Full responsibility of the confidentiality of this discharge information lies with you and/or your care-partner.

## 2024-04-08 NOTE — Op Note (Signed)
 [facility] Patient Name: Olivia Hernandez Procedure Date: 04/08/2024 9:36 AM MRN: [id number] Endoscopist: Sandor Flatter , MD, [id number] Age: 48 Referring MD:  Date of Birth: May 21, 1976 Gender: Female Account #: [id number] Procedure:                Colonoscopy Indications:              Surveillance: Personal history of colonic polyps                            (hyperplastic polyps) on last colonoscopy at                            outside facility in 2023, with recommendation for                            short interval repeat due to inadequate bowel                            preparation. Incidental - Positive Cologuard test                            at that time. Otherwise, no recent overt bleeding. Medicines:                Monitored Anesthesia Care Procedure:                Pre-Anesthesia Assessment:                           - Prior to the procedure, a History and Physical                            was performed, and patient medications and                            allergies were reviewed. The patient's tolerance of                            previous anesthesia was also reviewed. The risks                            and benefits of the procedure and the sedation                            options and risks were discussed with the patient.                            All questions were answered, and informed consent                            was obtained. Prior Anticoagulants: The patient has                            taken no anticoagulant or antiplatelet agents. ASA  Grade Assessment: II - A patient with mild systemic                            disease. After reviewing the risks and benefits,                            the patient was deemed in satisfactory condition to                            undergo the procedure.                           After obtaining informed consent, the colonoscope                            was passed under  direct vision. Throughout the                            procedure, the patient's blood pressure, pulse, and                            oxygen saturations were monitored continuously. The                            Olympus CF-HQ190L ([id number]) Colonoscope was                            introduced through the anus and advanced to the the                            terminal ileum. The colonoscopy was performed                            without difficulty. The patient tolerated the                            procedure well. The quality of the bowel                            preparation was excellent. The terminal ileum,                            ileocecal valve, appendiceal orifice, and rectum                            were photographed. Scope In: 9:46:28 AM Scope Out: 10:08:02 AM Scope Withdrawal Time: 0 hours 17 minutes 35 seconds  Total Procedure Duration: 0 hours 21 minutes 34 seconds  Findings:                 The perianal exam findings include scarring from                            prior surgeries, hemorrhoids, and skin tags.  There was evidence of a prior end-to-end                            colo-colonic anastomosis in the distal transverse                            colon. This was characterized by moderate stenosis.                            The anastomosis was traversed. Biopsies were taken                            with a cold forceps for histology. Estimated blood                            loss was minimal.                           A 3 mm polyp was found in the sigmoid colon. The                            polyp was sessile. The polyp was removed with a                            cold snare. Resection and retrieval were complete.                            Estimated blood loss was minimal.                           The mucosa was otherwise normal appearing                            throughout the remainder of the colon.                            Non-bleeding internal hemorrhoids were found during                            retroflexion. The hemorrhoids were small.                           The terminal ileum appeared normal. Advanced                            approximately 10 cm into the ileum. Complications:            No immediate complications. Estimated Blood Loss:     Estimated blood loss was minimal. Impression:               - Scarring from prior surgeries, hemorrhoids, and                            skin tags. found on perianal exam.                           -  End-to-end colo-colonic anastomosis,                            characterized by moderate stenosis. Biopsied.                           - One 3 mm polyp in the sigmoid colon, removed with                            a cold snare. Resected and retrieved.                           - Normal mucosa in the entire examined colon.                           - Non-bleeding internal hemorrhoids.                           - The examined portion of the ileum was normal. Recommendation:           - Patient has a contact number available for                            emergencies. The signs and symptoms of potential                            delayed complications were discussed with the                            patient. Return to normal activities tomorrow.                            Written discharge instructions were provided to the                            patient.                           - Resume previous diet.                           - Continue present medications.                           - Await pathology results.                           - Repeat colonoscopy for surveillance based on                            pathology results.                           - Return to GI office PRN. Sandor Flatter, MD 04/08/2024 10:19:09 AM

## 2024-04-08 NOTE — Progress Notes (Signed)
 Report to PACU, RN, vss, BBS= Clear.

## 2024-04-08 NOTE — Progress Notes (Signed)
 GASTROENTEROLOGY PROCEDURE H&P NOTE   Primary Care Physician: Nedra Tinnie LABOR, NP    Reason for Procedure:   Personal history of colon polyps/history of positive cologuard   Plan:    Colonoscopy  Patient is appropriate for endoscopic procedure(s) in the ambulatory ([facility]) setting.  The nature of the procedure, as well as the risks, benefits, and alternatives were carefully and thoroughly reviewed with the patient. Ample time for discussion and questions allowed. The patient understood, was satisfied, and agreed to proceed.     HPI: Olivia Hernandez is a 48 y.o. female who presents for Colonoscopy for evaluation of prior +Cologuard along with hx of colon polyps.  Patient was most recently seen in the Gastroenterology Clinic on 03/25/2024.  No interval change in medical history since that appointment. Please refer to that note for full details regarding GI history and clinical presentation.  11/15/2021 colonoscopy in [place] for positive Cologuard, preparation of colon was poor, abnormal Peri anal exam, surgical anastomosis at site of prior ostomy healthy-appearing mucosa, 4 polyps 3 to 5 mm sigmoid colon, 4 polyps 2 to 4 mm in the rectum.    Polyps hyperplastic Recall colonoscopy 1 year.   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 Ultimately underwent diverting colostomy for extensive perineal and perirectal hydradenitis in 2014, s/p takedown in 2017.   Hx of RA. Follows with Rheumatology and considering starting Remicade.   Past Medical History:  Diagnosis Date   Anemia    Arthritis    Blood transfusion without reported diagnosis    Depression    Rectal abscess     Past Surgical History:  Procedure Laterality Date   CESAREAN SECTION  05/01/2004   COLON SURGERY  07/06/2014   Had a colostomy surgery   COLOSTOMY     COLOSTOMY REVERSAL     RECTAL SURGERY     TUBAL LIGATION  05/01/2004    Prior to Admission medications   Medication Sig Start  Date End Date Taking? Authorizing Provider  acetaminophen (TYLENOL) 500 MG tablet Take 1 tablet by mouth every 6 (six) hours as needed.   Yes [provider]  Na Sulfate-K Sulfate-Mg Sulfate concentrate (SUPREP) 17.5-3.13-1.6 GM/177ML SOLN Take by mouth once. 03/25/24  Yes [provider]  cyclobenzaprine  (FLEXERIL ) 10 MG tablet Take 1 tablet (10 mg total) by mouth 3 (three) times daily as needed for muscle spasms. 03/23/24   McElwee, Lauren A, NP  naproxen  (NAPROSYN ) 500 MG tablet TAKE 1 TABLET(500 MG) BY MOUTH TWICE DAILY WITH A MEAL 04/07/24   McElwee, Lauren A, NP  nystatin  (MYCOSTATIN /NYSTOP ) powder Apply 1 Application topically 3 (three) times daily. Patient not taking: No sig reported 02/18/23   McElwee, Lauren A, NP  ofloxacin  (FLOXIN ) 0.3 % OTIC solution Place 5 drops into the right ear daily. Patient not taking: No sig reported 02/18/23   McElwee, Lauren A, NP  omeprazole  (PRILOSEC) 40 MG capsule TAKE 1 CAPSULE(40 MG) BY MOUTH DAILY FOR STOMACH PAIN 12/24/23   McElwee, Tinnie LABOR, NP    Current Outpatient Medications  Medication Sig Dispense Refill   acetaminophen (TYLENOL) 500 MG tablet Take 1 tablet by mouth every 6 (six) hours as needed.     Na Sulfate-K Sulfate-Mg Sulfate concentrate (SUPREP) 17.5-3.13-1.6 GM/177ML SOLN Take by mouth once.     cyclobenzaprine  (FLEXERIL ) 10 MG tablet Take 1 tablet (10 mg total) by mouth 3 (three) times daily as needed for muscle spasms. 30 tablet 0   naproxen  (NAPROSYN ) 500 MG tablet TAKE  1 TABLET(500 MG) BY MOUTH TWICE DAILY WITH A MEAL 30 tablet 0   nystatin  (MYCOSTATIN /NYSTOP ) powder Apply 1 Application topically 3 (three) times daily. (Patient not taking: No sig reported) 15 g 0   ofloxacin  (FLOXIN ) 0.3 % OTIC solution Place 5 drops into the right ear daily. (Patient not taking: No sig reported) 5 mL 0   omeprazole  (PRILOSEC) 40 MG capsule TAKE 1 CAPSULE(40 MG) BY MOUTH DAILY FOR STOMACH PAIN 30 capsule 1   Current  Facility-Administered Medications  Medication Dose Route Frequency Provider Last Rate Last Admin   0.9 %  sodium chloride  infusion  500 mL Intravenous Continuous Darleth Eustache V, DO        Allergies as of 04/08/2024 - Review Complete 04/08/2024  Allergen Reaction Noted   Polycillin-n [ampicillin] Swelling 02/18/2023    Family History  Problem Relation Age of Onset   Alcohol abuse Mother    COPD Mother    Diabetes Mother    Drug abuse Mother    Heart disease Mother    Hypertension Mother    Stroke Mother    Varicose Veins Mother    Cancer Father        liver   COPD Father    Drug abuse Father    Hypertension Father    Kidney disease Daughter    Drug abuse Son    Early death Son    Breast cancer Neg Hx     Social History   Socioeconomic History   Marital status: Divorced    Spouse name: Not on file   Number of children: 6   Years of education: Not on file   Highest education level: Not on file  Occupational History   Not on file  Tobacco Use   Smoking status: Some Days    Current packs/day: 0.00    Types: Cigarettes   Smokeless tobacco: Never   Tobacco comments:    Smoked on and off for years  Vaping Use   Vaping status: Never Used  Substance and Sexual Activity   Alcohol use: Not Currently    Comment: I rarely drink   Drug use: Never   Sexual activity: Yes    Birth control/protection: Surgical  Other Topics Concern   Not on file  Social History Narrative   Not on file   Social Drivers of Health   Financial Resource Strain: Not on file  Food Insecurity: No Food Insecurity (05/24/2021)   Received from [employer] Vital Sign    Within the past 12 months, you worried that your food would run out before you got the money to buy more.: Never true    Within the past 12 months, the food you bought just didn't last and you didn't have money to get more.: Never true  Transportation Needs: Not on file  Physical Activity: Not on file  Stress: Not on  file  Social Connections: Unknown (03/19/2023)   Received from [employer]    How often do you feel lonely or isolated from those around you? (Adult - for ages 55 years and over): Not on file  Intimate Partner Violence: Not on file    Physical Exam: Vital signs in last 24 hours: @BP  123/76   Pulse 90   Temp (!) 97.3 F (36.3 C)   Ht 5' 9 (1.753 m)   Wt 142 lb (64.4 kg)   LMP 02/23/2024 (Approximate)   SpO2 99%   BMI 20.97 kg/m  GEN: NAD  EYE: Sclerae anicteric ENT: MMM CV: Non-tachycardic Pulm: CTA b/l GI: Soft, NT/ND NEURO:  Alert & Oriented x 3   Sandor Flatter, DO [facility]   04/08/2024 9:35 AM

## 2024-04-09 ENCOUNTER — Telehealth: Payer: Self-pay

## 2024-04-09 NOTE — Telephone Encounter (Signed)
 Follow up call to pt, no answer.

## 2024-04-13 LAB — SURGICAL PATHOLOGY

## 2024-04-14 ENCOUNTER — Ambulatory Visit: Payer: Self-pay | Admitting: Gastroenterology

## 2024-05-25 ENCOUNTER — Ambulatory Visit: Admitting: Physician Assistant

## 2024-06-16 NOTE — Progress Notes (Deleted)
 06/16/2024 Olivia Hernandez [id number] 1976/09/09  Referring provider: Nedra Tinnie LABOR, NP Primary GI doctor: Dr. San  ASSESSMENT AND PLAN:   Personal history of colon polyps/history of positive cologuard No family history of colon cancer 04/08/2024 colonoscopy Dr. San and in colocolonic anastomosis moderate stenosis, 3 mm polyp sigmoid colon normal mucosa throughout entire colon internal hemorrhoids normal TI scarring from prior surgeries hemorrhoids and skin tags pathology showed focal active colitis without chronic changes most typically seen with medications, hyperplastic polyp If ongoing symptoms of constipation consider repeat colonoscopy with balloon dilation in the future otherwise recall 10 years  History of hidradenitis supportiva With multiple surgeries and debridements History of diverting colostomy for extensive perineal and perirectal hydradenitis in 2017 s/p takedown unable to see pathology from surgery in care everywhere Has seen RA and may start on remicaide infusions  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  - likely menses, normal colon, consider EGD but no UGI symptoms - start on oral iron, consider infusions if unable to tolerate  Chronic constipation - Increase fiber/ water intake, decrease caffeine, increase activity level. - continue miralax as needed, information given  Rheumatoid arthritis May be starting on remicaide  Patient Care Team: Nedra Tinnie LABOR, NP as PCP - General (Internal Medicine)  HISTORY OF PRESENT ILLNESS: 48 y.o. female with a past medical history listed below presents for evaluation of colonoscopy.   Discussed the use of AI scribe software for clinical note transcription with the patient, who gave verbal consent to proceed.  History of  Present Illness          She  reports that she has been smoking cigarettes. She has never used smokeless tobacco. She reports that she does not currently use alcohol. She reports that she does not use drugs.  RELEVANT GI HISTORY, IMAGING AND LABS: Results         11/15/2021 colonoscopy in [place] for positive Cologuard, preparation of colon was poor, abnormal Peri anal exam, surgical anastomosis at site of prior ostomy healthy-appearing mucosa, 4 polyps 3 to 5 mm sigmoid colon, 4 polyps 2 to 4 mm in the rectum.    Polyps hyperplastic  04/08/2024 colonoscopy Dr. San and in colocolonic anastomosis moderate stenosis, 3 mm polyp sigmoid colon normal mucosa throughout entire colon internal hemorrhoids normal TI scarring from prior surgeries hemorrhoids and skin tags pathology showed focal active colitis without chronic changes most typically seen with medications, hyperplastic polyp If ongoing symptoms of constipation consider repeat colonoscopy with balloon dilation in the future otherwise recall 10 years CBC    Component Value Date/Time   WBC 8.0 11/28/2023 1546   RBC 3.91 11/28/2023 1546   HGB 11.3 (L) 11/28/2023 1546   HCT 35.1 (L) 11/28/2023 1546   PLT 376.0 11/28/2023 1546   MCV 89.8 11/28/2023 1546   MCH 30.6 02/21/2022 1854   MCHC 32.3 11/28/2023 1546   RDW 16.0 (H) 11/28/2023 1546   LYMPHSABS 3.3 11/28/2023 1546   MONOABS 0.6 11/28/2023 1546   EOSABS 0.1 11/28/2023 1546   BASOSABS 0.1 11/28/2023 1546   Recent Labs    11/28/23 1546  HGB 11.3*    CMP     Component Value Date/Time   NA 138 11/28/2023 1546   K 4.0 11/28/2023 1546   CL  103 11/28/2023 1546   CO2 27 11/28/2023 1546   GLUCOSE 93 11/28/2023 1546   BUN 10 11/28/2023 1546   CREATININE 0.71 11/28/2023 1546   CALCIUM 8.7 11/28/2023 1546   PROT 7.7 11/28/2023 1546   ALBUMIN 4.1 11/28/2023 1546   AST 17 11/28/2023 1546   ALT 10 11/28/2023 1546   ALKPHOS 53 11/28/2023 1546   BILITOT 0.3 11/28/2023 1546    GFRNONAA >60 02/21/2022 1854      Latest Ref Rng & Units 11/28/2023    3:46 PM 03/18/2023    3:59 PM 02/21/2022    6:54 PM  Hepatic Function  Total Protein 6.0 - 8.3 g/dL 7.7  7.3  7.1   Albumin 3.5 - 5.2 g/dL 4.1  4.1  4.0   AST 0 - 37 U/L 17  14  19    ALT 0 - 35 U/L 10  10  15    Alk Phosphatase 39 - 117 U/L 53  54  46   Total Bilirubin 0.2 - 1.2 mg/dL 0.3  0.4  0.4       Current Medications:      Current Outpatient Medications (Analgesics):    acetaminophen (TYLENOL) 500 MG tablet, Take 1 tablet by mouth every 6 (six) hours as needed.   naproxen  (NAPROSYN ) 500 MG tablet, TAKE 1 TABLET(500 MG) BY MOUTH TWICE DAILY WITH A MEAL   Current Outpatient Medications (Other):    cyclobenzaprine  (FLEXERIL ) 10 MG tablet, Take 1 tablet (10 mg total) by mouth 3 (three) times daily as needed for muscle spasms.   Na Sulfate-K Sulfate-Mg Sulfate concentrate (SUPREP) 17.5-3.13-1.6 GM/177ML SOLN, Take by mouth once.   nystatin  (MYCOSTATIN /NYSTOP ) powder, Apply 1 Application topically 3 (three) times daily. (Patient not taking: No sig reported)   ofloxacin  (FLOXIN ) 0.3 % OTIC solution, Place 5 drops into the right ear daily. (Patient not taking: No sig reported)   omeprazole  (PRILOSEC) 40 MG capsule, TAKE 1 CAPSULE(40 MG) BY MOUTH DAILY FOR STOMACH PAIN  Medical History:  Past Medical History:  Diagnosis Date   Anemia    Arthritis    Blood transfusion without reported diagnosis    Depression    Rectal abscess    Allergies:  Allergies  Allergen Reactions   Polycillin-N [Ampicillin] Swelling    Tongue Swelled     Surgical History:  She  has a past surgical history that includes Rectal surgery; Colostomy; Colostomy reversal; Cesarean section (05/01/2004); Tubal ligation (05/01/2004); and Colon surgery (07/06/2014). Family History:  Her family history includes Alcohol abuse in her mother; COPD in her father and mother; Cancer in her father; Diabetes in her mother; Drug abuse in her  father, mother, and son; Early death in her son; Heart disease in her mother; Hypertension in her father and mother; Kidney disease in her daughter; Stroke in her mother; Varicose Veins in her mother.  REVIEW OF SYSTEMS  : All other systems reviewed and negative except where noted in the History of Present Illness.  PHYSICAL EXAM: There were no vitals taken for this visit. Physical Exam          Olivia JONELLE Coombs, PA-C 8:25 AM

## 2024-06-17 ENCOUNTER — Ambulatory Visit: Admitting: Physician Assistant

## 2024-10-13 ENCOUNTER — Encounter: Payer: Self-pay | Admitting: Nurse Practitioner

## 2024-10-13 ENCOUNTER — Ambulatory Visit: Admitting: Nurse Practitioner

## 2024-10-13 VITALS — BP 104/78 | HR 84 | Ht 69.0 in

## 2024-10-13 DIAGNOSIS — N951 Menopausal and female climacteric states: Secondary | ICD-10-CM | POA: Diagnosis not present

## 2024-10-13 DIAGNOSIS — M0609 Rheumatoid arthritis without rheumatoid factor, multiple sites: Secondary | ICD-10-CM

## 2024-10-13 DIAGNOSIS — H60503 Unspecified acute noninfective otitis externa, bilateral: Secondary | ICD-10-CM | POA: Diagnosis not present

## 2024-10-13 DIAGNOSIS — N6342 Unspecified lump in left breast, subareolar: Secondary | ICD-10-CM | POA: Diagnosis not present

## 2024-10-13 DIAGNOSIS — B379 Candidiasis, unspecified: Secondary | ICD-10-CM | POA: Insufficient documentation

## 2024-10-13 MED ORDER — KETOCONAZOLE 2 % EX CREA: 1.0000 | TOPICAL_CREAM | Freq: Every day | CUTANEOUS | 0 refills | Status: AC

## 2024-10-13 MED ORDER — OFLOXACIN 0.3 % OT SOLN: 5.0000 [drp] | Freq: Every day | OTIC | 0 refills | Status: AC

## 2024-10-13 MED ORDER — FLUCONAZOLE 150 MG PO TABS: ORAL_TABLET | ORAL | 0 refills | Status: AC

## 2024-10-13 NOTE — Progress Notes (Signed)
 "  Acute Office Visit  Subjective:     Patient ID: Olivia Hernandez, female    DOB: 08-14-1976, 49 y.o.   MRN: [id number]  Chief Complaint  Patient presents with   Rash and Drainage from Ears    Rash underarms and belly button and drainage from both ears for 2 days    HPI Discussed the use of AI scribe software for clinical note transcription with the patient, who gave verbal consent to proceed.  History of Present Illness   Olivia Hernandez is a 49 year old female with rheumatoid arthritis who presents with a rash and ear drainage.  Over the past couple of days she developed an itchy, painful rash described as raw and stinging, now involving her bilateral axilla and ear drainage. A previously prescribed nystatin  powder worsened burning and wetness of the rash, while Eucerin cream helped a prior similar rash on her arms but the rash has recurred. She denies new soaps or shampoos.  She has frequent hot flashes and night sweats that occur multiple times daily and disrupt sleep and work due to heavy sweating. Estroven and recently resumed Ashwaghanda have provided only partial relief.  She noticed a lump in her breast that was initially painful but is now painless. She notes there is still redness. She denies fevers and drainage.   She has itchy ears with drainage but no ear pain, fever, stuffy or runny nose, or sore throat. Current medications include Cosentyx for rheumatoid arthritis, Eucerin cream for the rash, and Estroven for hot flashes.     ROS See pertinent positives and negatives per HPI.     Objective:    BP 104/78 (BP Location: Left Arm, Patient Position: Sitting, Cuff Size: Normal)   Pulse 84   Ht 5' 9 (1.753 m)   LMP 07/14/2024 (Approximate)   SpO2 100%   BMI 20.97 kg/m    Physical Exam Vitals and nursing note reviewed.  Constitutional:      General: She is not in acute distress.    Appearance: Normal appearance.  HENT:     Head: Normocephalic.     Right Ear:  Drainage present.     Left Ear: Drainage present.  Eyes:     Conjunctiva/sclera: Conjunctivae normal.  Cardiovascular:     Rate and Rhythm: Normal rate and regular rhythm.     Pulses: Normal pulses.     Heart sounds: Normal heart sounds.  Pulmonary:     Effort: Pulmonary effort is normal.     Breath sounds: Normal breath sounds.  Chest:  Breasts:    Left: Mass (raised cystic area to left subaureolar area) present.  Musculoskeletal:     Cervical back: Normal range of motion.  Skin:    General: Skin is warm.     Findings: Erythema (redness and mosture to bilateral axilla and umbilicus) present.  Neurological:     General: No focal deficit present.     Mental Status: She is alert and oriented to person, place, and time.  Psychiatric:        Mood and Affect: Mood normal.        Behavior: Behavior normal.        Thought Content: Thought content normal.        Judgment: Judgment normal.       Assessment & Plan:   Problem List Items Addressed This Visit       Musculoskeletal and Integument   Rheumatoid arthritis of multiple sites with negative rheumatoid factor (HCC)  Chronic, stable. Continue cosentyx, however can discuss if this is causing rashes with her rheumatologist.         Other   Candidiasis   Recurrent candidiasis may be exacerbated by Cosentyx, though no fever or new irritants are present. Prescribe Diflucan , one tablet today and another in three days. Apply ketoconazole  cream once daily to affected areas. Wash affected areas with soap and water daily, pat dry, and apply cream. Contact rheumatologist about potential medication side effects and consider adjusting medication. Keep affected areas dry and shower after workouts.      Relevant Medications   fluconazole  (DIFLUCAN ) 150 MG tablet   ketoconazole  (NIZORAL ) 2 % cream   Subareolar mass of left breast - Primary   A non-painful, stable subareolar mass in the left breast could be a benign cyst or other  pathology. Order a mammogram and ultrasound of the left breast. Use warm compresses to potentially reduce the size of the mass.      Relevant Orders   MM 3D DIAGNOSTIC MAMMOGRAM BILATERAL BREAST   US  BREAST COMPLETE UNI LEFT INC AXILLA   Menopausal symptoms   Frequent hot flashes and night sweats are likely due to menopause, with current medications providing some relief. Continue Estroven and Ashwaghanda. Monitor symptoms and follow up if hot flashes do not improve.       Other Visit Diagnoses       Acute otitis externa of both ears, unspecified type       Start oflaxacin ear drops, 5 drops in each ear for 10 minutes daily x5.       Meds ordered this encounter  Medications   fluconazole  (DIFLUCAN ) 150 MG tablet    Sig: Take 1 tablet today and another tablet in 3 days    Dispense:  2 tablet    Refill:  0   ofloxacin  (FLOXIN ) 0.3 % OTIC solution    Sig: Place 5 drops into both ears daily.    Dispense:  10 mL    Refill:  0   ketoconazole  (NIZORAL ) 2 % cream    Sig: Apply 1 Application topically daily.    Dispense:  15 g    Refill:  0    Return in about 3 months (around 01/11/2025) for CPE.  Tinnie DELENA Harada, NP   "

## 2024-10-13 NOTE — Assessment & Plan Note (Signed)
 Frequent hot flashes and night sweats are likely due to menopause, with current medications providing some relief. Continue Estroven and Ashwaghanda. Monitor symptoms and follow up if hot flashes do not improve.

## 2024-10-13 NOTE — Assessment & Plan Note (Signed)
 Recurrent candidiasis may be exacerbated by Cosentyx, though no fever or new irritants are present. Prescribe Diflucan , one tablet today and another in three days. Apply ketoconazole  cream once daily to affected areas. Wash affected areas with soap and water daily, pat dry, and apply cream. Contact rheumatologist about potential medication side effects and consider adjusting medication. Keep affected areas dry and shower after workouts.

## 2024-10-13 NOTE — Assessment & Plan Note (Signed)
 A non-painful, stable subareolar mass in the left breast could be a benign cyst or other pathology. Order a mammogram and ultrasound of the left breast. Use warm compresses to potentially reduce the size of the mass.

## 2024-10-13 NOTE — Patient Instructions (Addendum)
 It was great to see you!  Wash the areas with soap and water daily, pat dry make sure it stays dry. Then apply ketoconazole  cream once a day  Start diflucan  1 tablet today then another tablet in 3 days   Start the ear drops, 5 drops once a day in both ears   I have ordered a mammogram - they will call and schedule   Let's follow-up if symptoms worsen or any concerns   Take care,  Tinnie Harada, NP

## 2024-10-13 NOTE — Assessment & Plan Note (Signed)
 Chronic, stable. Continue cosentyx, however can discuss if this is causing rashes with her rheumatologist.

## 2024-10-14 ENCOUNTER — Telehealth: Payer: Self-pay | Admitting: Nurse Practitioner

## 2024-10-14 NOTE — Telephone Encounter (Signed)
 Noted. Letter sent to patient's Mychart.

## 2024-10-14 NOTE — Telephone Encounter (Signed)
 Copied from CRM [id number]. Topic: General - Other >> Oct 14, 2024  8:05 AM Adelita E wrote: Reason for CRM: Patient is wanting her work note extended until 1/19. Patient questioning if this can get sent through MyChart.

## 2024-10-15 ENCOUNTER — Encounter: Payer: Self-pay | Admitting: Nurse Practitioner

## 2024-10-15 MED ORDER — TRAMADOL HCL 50 MG PO TABS: 50.0000 mg | ORAL_TABLET | Freq: Two times a day (BID) | ORAL | 0 refills | Status: AC | PRN

## 2024-10-18 ENCOUNTER — Ambulatory Visit (HOSPITAL_COMMUNITY)
Admission: RE | Admit: 2024-10-18 | Discharge: 2024-10-18 | Disposition: A | Discharge status: Home / Self Care | Source: Ambulatory Visit

## 2024-10-18 ENCOUNTER — Encounter (HOSPITAL_COMMUNITY): Payer: Self-pay

## 2024-10-18 VITALS — BP 111/73 | HR 86 | Temp 97.9°F | Resp 16

## 2024-10-18 DIAGNOSIS — R21 Rash and other nonspecific skin eruption: Secondary | ICD-10-CM | POA: Diagnosis present

## 2024-10-18 DIAGNOSIS — H60393 Other infective otitis externa, bilateral: Secondary | ICD-10-CM | POA: Insufficient documentation

## 2024-10-18 DIAGNOSIS — L03119 Cellulitis of unspecified part of limb: Secondary | ICD-10-CM | POA: Diagnosis present

## 2024-10-18 MED ORDER — CIPROFLOXACIN-DEXAMETHASONE 0.3-0.1 % OT SUSP: 4.0000 [drp] | Freq: Two times a day (BID) | OTIC | 0 refills | Status: AC

## 2024-10-18 MED ORDER — DOXYCYCLINE HYCLATE 100 MG PO CAPS: 100.0000 mg | ORAL_CAPSULE | Freq: Two times a day (BID) | ORAL | 0 refills | Status: AC

## 2024-10-18 MED ORDER — LIDOCAINE-PRILOCAINE 2.5-2.5 % EX CREA: 1.0000 | TOPICAL_CREAM | CUTANEOUS | 2 refills | Status: AC | PRN

## 2024-10-18 MED ORDER — MUPIROCIN CALCIUM 2 % EX CREA: 1.0000 | TOPICAL_CREAM | Freq: Two times a day (BID) | CUTANEOUS | 0 refills | Status: AC

## 2024-10-18 MED ORDER — DICLOFENAC SODIUM 50 MG PO TBEC: 50.0000 mg | DELAYED_RELEASE_TABLET | Freq: Two times a day (BID) | ORAL | 1 refills | Status: AC

## 2024-10-18 NOTE — ED Provider Notes (Signed)
 " [facility]  Note:  This document was prepared using Dragon voice recognition software and may include unintentional dictation errors.  MRN: [id number] DOB: March 26, 1976  Subjective:   Olivia Hernandez is a 49 y.o. female presenting for evaluation of erythematous, painful, weeping rash to bilateral axilla, scalp, bellybutton and bilateral ears x 2 weeks.  Patient reports that she saw her PCP who prescribed ketoconazole , tramadol , ofloxacin  eardrops but denies any relief of symptoms.  Patient reports that fungal cream that was prescribed caused the skin to burn and increased pain.  Patient reports that she initially had a rash like this a couple of months ago but patient used Eucerin lotion which seemed to resolve rash.  Patient states that this time the rash did not respond to Eucerin.  Denies any change in lotions soap, deodorant, detergents.  Patient states that rash under her left armpit and bilateral ears has been weeping clearish yellow fluid.  Patient denies any previous evaluation by dermatology.  Current Medications[1]   Allergies[2]  Past Medical History:  Diagnosis Date   Anemia    Arthritis    Blood transfusion without reported diagnosis    Depression    Rectal abscess      Past Surgical History:  Procedure Laterality Date   CESAREAN SECTION  05/01/2004   COLON SURGERY  07/06/2014   Had a colostomy surgery   COLOSTOMY     COLOSTOMY REVERSAL     RECTAL SURGERY     TUBAL LIGATION  05/01/2004    Family History  Problem Relation Age of Onset   Alcohol abuse Mother    COPD Mother    Diabetes Mother    Drug abuse Mother    Heart disease Mother    Hypertension Mother    Stroke Mother    Varicose Veins Mother    Cancer Father        liver   COPD Father    Drug abuse Father    Hypertension Father    Kidney disease Daughter    Drug abuse Son    Early death Son    Breast cancer Neg Hx     Social History[3]  ROS Refer to HPI for ROS  details.  Objective:    Vitals: BP 111/73 (BP Location: Right Arm)   Pulse 86   Temp 97.9 F (36.6 C) (Oral)   Resp 16   LMP 07/14/2024 (Approximate)   SpO2 98%   Physical Exam Vitals and nursing note reviewed.  Constitutional:      General: She is not in acute distress.    Appearance: Normal appearance. She is not ill-appearing.  HENT:     Head: Normocephalic.  Cardiovascular:     Rate and Rhythm: Normal rate.  Pulmonary:     Effort: Pulmonary effort is normal. No respiratory distress.  Skin:    General: Skin is warm and dry.     Capillary Refill: Capillary refill takes less than 2 seconds.     Findings: Erythema, rash and wound present. No abrasion or abscess.  Neurological:     General: No focal deficit present.     Mental Status: She is alert and oriented to person, place, and time.  Psychiatric:        Mood and Affect: Mood normal.        Behavior: Behavior normal.     Procedures  No results found for this or any previous visit (from the past 24 hours).  Assessment and Plan :  Discharge Instructions       1. Rash and nonspecific skin eruption (Primary) 2. Cellulitis of axilla, unspecified laterality - lidocaine -prilocaine  (EMLA ) cream; Apply 1 Application topically as needed.  Dispense: 30 g; Refill: 2 - mupirocin  cream (BACTROBAN ) 2 %; Apply 1 Application topically 2 (two) times daily.  Dispense: 15 g; Refill: 0 - doxycycline  (VIBRAMYCIN ) 100 MG capsule; Take 1 capsule (100 mg total) by mouth 2 (two) times daily.  Dispense: 20 capsule; Refill: 0 - Ambulatory referral to Dermatology for follow-up evaluation and ongoing management of abnormal skin rash with secondary cellulitis. - Aerobic Culture w Gram Stain (superficial specimen) collected today in UC and sent to lab for further testing results should be available in 2 to 3 days.  -Continue to monitor symptoms for any change in severity if there is any escalation of current symptoms or development of  new symptoms follow-up in ER for further evaluation and management.      Freddye Cardamone B Saad Buhl    [1] No current facility-administered medications for this encounter.  Current Outpatient Medications:    acetaminophen (TYLENOL) 500 MG tablet, Take 1 tablet by mouth every 6 (six) hours as needed., Disp: , Rfl:    ciprofloxacin -dexamethasone  (CIPRODEX ) OTIC suspension, Place 4 drops into both ears 2 (two) times daily., Disp: 7.5 mL, Rfl: 0   diclofenac  (VOLTAREN ) 50 MG EC tablet, Take 1 tablet (50 mg total) by mouth 2 (two) times daily., Disp: 30 tablet, Rfl: 1   doxycycline  (VIBRAMYCIN ) 100 MG capsule, Take 1 capsule (100 mg total) by mouth 2 (two) times daily., Disp: 20 capsule, Rfl: 0   ketoconazole  (NIZORAL ) 2 % cream, Apply 1 Application topically daily., Disp: 15 g, Rfl: 0   lidocaine -prilocaine  (EMLA ) cream, Apply 1 Application topically as needed., Disp: 30 g, Rfl: 2   mupirocin  cream (BACTROBAN ) 2 %, Apply 1 Application topically 2 (two) times daily., Disp: 15 g, Rfl: 0   ofloxacin  (FLOXIN ) 0.3 % OTIC solution, Place 5 drops into both ears daily., Disp: 10 mL, Rfl: 0   traMADol  (ULTRAM ) 50 MG tablet, Take 1 tablet (50 mg total) by mouth every 12 (twelve) hours as needed for up to 5 days., Disp: 10 tablet, Rfl: 0   cyclobenzaprine  (FLEXERIL ) 10 MG tablet, Take 1 tablet (10 mg total) by mouth 3 (three) times daily as needed for muscle spasms., Disp: 30 tablet, Rfl: 0   fluconazole  (DIFLUCAN ) 150 MG tablet, Take 1 tablet today and another tablet in 3 days, Disp: 2 tablet, Rfl: 0   Na Sulfate-K Sulfate-Mg Sulfate concentrate (SUPREP) 17.5-3.13-1.6 GM/177ML SOLN, Take by mouth once. (Patient not taking: Reported on 10/13/2024), Disp: , Rfl:    omeprazole  (PRILOSEC) 40 MG capsule, TAKE 1 CAPSULE(40 MG) BY MOUTH DAILY FOR STOMACH PAIN, Disp: 30 capsule, Rfl: 1 [2]  Allergies Allergen Reactions   Polycillin-N [Ampicillin] Swelling    Tongue Swelled  [3]  Social History Tobacco Use    Smoking status: Some Days    Current packs/day: 0.00    Types: Cigarettes   Smokeless tobacco: Never   Tobacco comments:    Smoked on and off for years  Vaping Use   Vaping status: Never Used  Substance Use Topics   Alcohol use: Yes    Comment: I rarely drink   Drug use: Never     Aurea Goodell B, NP 10/18/24 [phone]  "

## 2024-10-18 NOTE — Discharge Instructions (Signed)
" °  1. Rash and nonspecific skin eruption (Primary) 2. Cellulitis of axilla, unspecified laterality - lidocaine -prilocaine  (EMLA ) cream; Apply 1 Application topically as needed.  Dispense: 30 g; Refill: 2 - mupirocin  cream (BACTROBAN ) 2 %; Apply 1 Application topically 2 (two) times daily.  Dispense: 15 g; Refill: 0 - doxycycline  (VIBRAMYCIN ) 100 MG capsule; Take 1 capsule (100 mg total) by mouth 2 (two) times daily.  Dispense: 20 capsule; Refill: 0 - Ambulatory referral to Dermatology for follow-up evaluation and ongoing management of abnormal skin rash with secondary cellulitis. - Aerobic Culture w Gram Stain (superficial specimen) collected today in UC and sent to lab for further testing results should be available in 2 to 3 days.  -Continue to monitor symptoms for any change in severity if there is any escalation of current symptoms or development of new symptoms follow-up in ER for further evaluation and management. "

## 2024-10-18 NOTE — ED Triage Notes (Addendum)
 Pt c/o red rash under both armpits, both ears inner and outer, belly button, and scalp. Pt describes as dry itchy and painful. Patient reports not being able to sleep due to pain. Rash started out dry and scaly, but now it is very red and inflamed. Skin is sticking together under armpits. Patient has not tried any powders. Pt reports that the areas weep. Pt saw PCP and was prescribed Ketoconazole , Tramadol , & Floxin  ear drops for symptoms. Patient has not had any relief with this regimen. Pt reports that the cream burned. This is the second occurrence of a rash like this - first time months ago, used Eucerin in the past with some relief. Denies any known fevers or rashes in lower extremities. Pt reports clear drainage from both ears x 2 weeks. Denies any use of new lotions, soaps, Deoderant, or detergents. No dermatologist.

## 2024-10-20 ENCOUNTER — Ambulatory Visit: Admitting: Physician Assistant

## 2024-10-20 ENCOUNTER — Encounter: Payer: Self-pay | Admitting: Physician Assistant

## 2024-10-20 VITALS — BP 113/83 | HR 95

## 2024-10-20 DIAGNOSIS — L409 Psoriasis, unspecified: Secondary | ICD-10-CM

## 2024-10-20 DIAGNOSIS — M1990 Unspecified osteoarthritis, unspecified site: Secondary | ICD-10-CM

## 2024-10-20 MED ORDER — MOMETASONE FUROATE 0.1 % EX OINT: TOPICAL_OINTMENT | Freq: Every day | CUTANEOUS | 2 refills | Status: AC

## 2024-10-20 MED ORDER — CLOBETASOL PROPIONATE 0.05 % EX SOLN: 1.0000 | Freq: Two times a day (BID) | CUTANEOUS | 2 refills | Status: AC

## 2024-10-20 MED ORDER — KETOCONAZOLE 2 % EX SHAM: 1.0000 | MEDICATED_SHAMPOO | Freq: Once | CUTANEOUS | 2 refills | Status: AC

## 2024-10-20 NOTE — Progress Notes (Unsigned)
" ° °  New Patient Visit   Subjective  Olivia Hernandez is a 49 y.o. female who presents for the following: Rash   Patient states she has Rash located at the under arms and belly button that she would like to have examined. Patient reports the areas have been there for 3 weeks. She reports the areas are bothersome.Patient rates irritation 10 out of 10. She states that the areas have spread to behind her ears and scalp. Patient reports she has previously been treated for these areas with doxycycline  and mupirocin  cream and she states that it did help. Patient denies Hx of bx.   The patient has spots, moles and lesions to be evaluated, some may be new or changing and the patient may have concern these could be cancer.   The following portions of the chart were reviewed this encounter and updated as appropriate: medications, allergies, medical history  Review of Systems:  No other skin or systemic complaints except as noted in HPI or Assessment and Plan.  Objective  Well appearing patient in no apparent distress; mood and affect are within normal limits.   A focused examination was performed of the following areas: Under arms, abdomen, ears and scalp  Relevant exam findings are noted in the Assessment and Plan.         Assessment & Plan   PSORIASIS Exam: Well-demarcated erythematous papules/plaques with silvery scale, guttate pink scaly papules. ***% BSA.  Not at goal   patient admits joint pain  Psoriasis is a chronic non-curable, but treatable genetic/hereditary disease that may have other systemic features affecting other organ systems such as joints (Psoriatic Arthritis). It is associated with an increased risk of inflammatory bowel disease, heart disease, non-alcoholic fatty liver disease, and depression.  Treatments include light and laser treatments; topical medications; and systemic medications including oral and injectables.  Treatment Plan:  Clobetasol  scalp  solution Ketoconazole  shampoo Mometasone   ointment     PSORIASIS   This Visit - mometasone  (ELOCON ) 0.1 % ointment - Apply topically daily. Apply twice daily to affected areas - clobetasol  (TEMOVATE ) 0.05 % external solution - Apply 1 Application topically 2 (two) times daily. Apply twice daily when needed - ketoconazole  (NIZORAL ) 2 % shampoo - Apply 1 Application topically once for 1 dose. Apply to scalp every other wash  Return in about 3 months (around 01/18/2025) for psoriasis follow up.  I, Doyce Pan, CMA, am acting as scribe for Ragina Fenter K, PA-C.   Documentation: I have reviewed the above documentation for accuracy and completeness, and I agree with the above.  Jager Koska K, PA-C     "

## 2024-10-20 NOTE — Patient Instructions (Signed)

## 2024-10-21 ENCOUNTER — Other Ambulatory Visit: Payer: Self-pay | Admitting: Nurse Practitioner

## 2024-10-21 ENCOUNTER — Encounter: Payer: Self-pay | Admitting: Physician Assistant

## 2024-10-21 ENCOUNTER — Ambulatory Visit (HOSPITAL_COMMUNITY): Payer: Self-pay

## 2024-10-21 DIAGNOSIS — H60503 Unspecified acute noninfective otitis externa, bilateral: Secondary | ICD-10-CM

## 2024-10-21 DIAGNOSIS — N951 Menopausal and female climacteric states: Secondary | ICD-10-CM

## 2024-10-21 DIAGNOSIS — N6342 Unspecified lump in left breast, subareolar: Secondary | ICD-10-CM

## 2024-10-21 DIAGNOSIS — M0609 Rheumatoid arthritis without rheumatoid factor, multiple sites: Secondary | ICD-10-CM

## 2024-10-21 DIAGNOSIS — R921 Mammographic calcification found on diagnostic imaging of breast: Secondary | ICD-10-CM

## 2024-10-21 DIAGNOSIS — B379 Candidiasis, unspecified: Secondary | ICD-10-CM

## 2024-10-21 LAB — AEROBIC CULTURE W GRAM STAIN (SUPERFICIAL SPECIMEN)

## 2024-10-26 ENCOUNTER — Other Ambulatory Visit

## 2024-10-26 ENCOUNTER — Encounter

## 2025-01-12 ENCOUNTER — Encounter: Admitting: Nurse Practitioner

## 2025-01-18 ENCOUNTER — Ambulatory Visit: Admitting: Physician Assistant
